# Patient Record
Sex: Male | Born: 1950 | Race: White | Hispanic: No | Marital: Married | Smoking: Former smoker
Health system: Southern US, Community
[De-identification: ages and names within clinical notes are randomized; demographics above are authoritative.]

## PROBLEM LIST (undated history)

## (undated) DIAGNOSIS — C801 Malignant (primary) neoplasm, unspecified: Secondary | ICD-10-CM

## (undated) DIAGNOSIS — Z789 Other specified health status: Secondary | ICD-10-CM

## (undated) DIAGNOSIS — M1990 Unspecified osteoarthritis, unspecified site: Secondary | ICD-10-CM

## (undated) DIAGNOSIS — K219 Gastro-esophageal reflux disease without esophagitis: Secondary | ICD-10-CM

## (undated) DIAGNOSIS — I10 Essential (primary) hypertension: Secondary | ICD-10-CM

## (undated) DIAGNOSIS — J189 Pneumonia, unspecified organism: Secondary | ICD-10-CM

## (undated) HISTORY — PX: OTHER SURGICAL HISTORY: SHX169

## (undated) HISTORY — DX: Gastro-esophageal reflux disease without esophagitis: K21.9

## (undated) HISTORY — PX: PROSTATE BIOPSY: SHX241

## (undated) HISTORY — PX: HAND SURGERY: SHX662

## (undated) HISTORY — DX: Other specified health status: Z78.9

---

## 1988-05-25 HISTORY — PX: EYE SURGERY: SHX253

## 2004-09-24 ENCOUNTER — Ambulatory Visit (HOSPITAL_BASED_OUTPATIENT_CLINIC_OR_DEPARTMENT_OTHER): Admission: RE | Admit: 2004-09-24 | Discharge: 2004-09-24 | Payer: Self-pay | Admitting: *Deleted

## 2019-02-23 DIAGNOSIS — R300 Dysuria: Secondary | ICD-10-CM | POA: Diagnosis not present

## 2019-02-23 DIAGNOSIS — Z6825 Body mass index (BMI) 25.0-25.9, adult: Secondary | ICD-10-CM | POA: Diagnosis not present

## 2019-03-16 DIAGNOSIS — N410 Acute prostatitis: Secondary | ICD-10-CM | POA: Diagnosis not present

## 2019-07-24 DIAGNOSIS — R69 Illness, unspecified: Secondary | ICD-10-CM | POA: Diagnosis not present

## 2019-07-31 DIAGNOSIS — R69 Illness, unspecified: Secondary | ICD-10-CM | POA: Diagnosis not present

## 2020-01-04 DIAGNOSIS — N410 Acute prostatitis: Secondary | ICD-10-CM | POA: Diagnosis not present

## 2020-01-04 DIAGNOSIS — Z6825 Body mass index (BMI) 25.0-25.9, adult: Secondary | ICD-10-CM | POA: Diagnosis not present

## 2020-05-06 DIAGNOSIS — H5200 Hypermetropia, unspecified eye: Secondary | ICD-10-CM | POA: Diagnosis not present

## 2020-05-06 DIAGNOSIS — H2513 Age-related nuclear cataract, bilateral: Secondary | ICD-10-CM | POA: Diagnosis not present

## 2020-05-06 DIAGNOSIS — Z0100 Encounter for examination of eyes and vision without abnormal findings: Secondary | ICD-10-CM | POA: Diagnosis not present

## 2020-10-17 DIAGNOSIS — Z20828 Contact with and (suspected) exposure to other viral communicable diseases: Secondary | ICD-10-CM | POA: Diagnosis not present

## 2021-07-08 DIAGNOSIS — K222 Esophageal obstruction: Secondary | ICD-10-CM | POA: Diagnosis not present

## 2021-07-08 DIAGNOSIS — Z6826 Body mass index (BMI) 26.0-26.9, adult: Secondary | ICD-10-CM | POA: Diagnosis not present

## 2021-07-08 DIAGNOSIS — R1030 Lower abdominal pain, unspecified: Secondary | ICD-10-CM | POA: Diagnosis not present

## 2021-07-08 DIAGNOSIS — R300 Dysuria: Secondary | ICD-10-CM | POA: Diagnosis not present

## 2021-07-08 DIAGNOSIS — R319 Hematuria, unspecified: Secondary | ICD-10-CM | POA: Diagnosis not present

## 2021-07-09 ENCOUNTER — Other Ambulatory Visit (HOSPITAL_COMMUNITY): Payer: Self-pay | Admitting: Family Medicine

## 2021-07-09 ENCOUNTER — Other Ambulatory Visit: Payer: Self-pay | Admitting: Family Medicine

## 2021-07-09 DIAGNOSIS — N200 Calculus of kidney: Secondary | ICD-10-CM

## 2021-07-09 DIAGNOSIS — R1030 Lower abdominal pain, unspecified: Secondary | ICD-10-CM

## 2021-07-09 DIAGNOSIS — R300 Dysuria: Secondary | ICD-10-CM

## 2021-07-10 ENCOUNTER — Ambulatory Visit: Payer: Medicare HMO | Admitting: Physician Assistant

## 2021-07-10 ENCOUNTER — Other Ambulatory Visit: Payer: Self-pay

## 2021-07-11 ENCOUNTER — Encounter: Payer: Self-pay | Admitting: Internal Medicine

## 2021-07-11 ENCOUNTER — Ambulatory Visit (HOSPITAL_COMMUNITY)
Admission: RE | Admit: 2021-07-11 | Discharge: 2021-07-11 | Disposition: A | Discharge status: Home / Self Care | Payer: Medicare HMO | Source: Ambulatory Visit | Attending: Family Medicine | Admitting: Family Medicine

## 2021-07-11 ENCOUNTER — Other Ambulatory Visit (HOSPITAL_COMMUNITY): Payer: Self-pay | Admitting: Family Medicine

## 2021-07-11 DIAGNOSIS — R1030 Lower abdominal pain, unspecified: Secondary | ICD-10-CM | POA: Insufficient documentation

## 2021-07-11 DIAGNOSIS — N200 Calculus of kidney: Secondary | ICD-10-CM

## 2021-07-11 DIAGNOSIS — R300 Dysuria: Secondary | ICD-10-CM | POA: Diagnosis not present

## 2021-07-11 DIAGNOSIS — N210 Calculus in bladder: Secondary | ICD-10-CM | POA: Diagnosis not present

## 2021-07-11 DIAGNOSIS — N3289 Other specified disorders of bladder: Secondary | ICD-10-CM | POA: Diagnosis not present

## 2021-07-16 ENCOUNTER — Encounter: Payer: Self-pay | Admitting: Urology

## 2021-07-16 ENCOUNTER — Ambulatory Visit: Payer: Medicare HMO | Admitting: Urology

## 2021-07-16 ENCOUNTER — Other Ambulatory Visit: Payer: Self-pay

## 2021-07-16 VITALS — BP 158/92 | HR 67 | Wt 164.0 lb

## 2021-07-16 DIAGNOSIS — R339 Retention of urine, unspecified: Secondary | ICD-10-CM

## 2021-07-16 DIAGNOSIS — N401 Enlarged prostate with lower urinary tract symptoms: Secondary | ICD-10-CM

## 2021-07-16 DIAGNOSIS — R300 Dysuria: Secondary | ICD-10-CM | POA: Diagnosis not present

## 2021-07-16 DIAGNOSIS — R8290 Unspecified abnormal findings in urine: Secondary | ICD-10-CM

## 2021-07-16 DIAGNOSIS — N200 Calculus of kidney: Secondary | ICD-10-CM

## 2021-07-16 DIAGNOSIS — N138 Other obstructive and reflux uropathy: Secondary | ICD-10-CM

## 2021-07-16 LAB — MICROSCOPIC EXAMINATION
Renal Epithel, UA: NONE SEEN /hpf
WBC, UA: >30 /hpf — AB (ref 0–5)

## 2021-07-16 LAB — URINALYSIS, ROUTINE W REFLEX MICROSCOPIC
Bilirubin, UA: NEGATIVE
Glucose, UA: NEGATIVE
Ketones, UA: NEGATIVE
Nitrite, UA: POSITIVE — AB
Specific Gravity, UA: 1.025 (ref 1.005–1.030)
Urobilinogen, Ur: 0.2 mg/dL (ref 0.2–1.0)
pH, UA: 6 (ref 5.0–7.5)

## 2021-07-16 LAB — BLADDER SCAN AMB NON-IMAGING

## 2021-07-16 MED ORDER — SULFAMETHOXAZOLE-TRIMETHOPRIM 800-160 MG PO TABS: 1.0000 | ORAL_TABLET | Freq: Two times a day (BID) | ORAL | 0 refills | Status: AC

## 2021-07-16 MED ORDER — ALFUZOSIN HCL ER 10 MG PO TB24: 10.0000 mg | ORAL_TABLET | Freq: Every day | ORAL | 11 refills | Status: DC

## 2021-07-16 NOTE — Progress Notes (Signed)
Assessment: 1. BPH with obstruction/lower urinary tract symptoms   2. Dysuria   3. Abnormal urine findings   4. Incomplete bladder emptying   5. Nephrolithiasis     Plan: I personally reviewed the CT study from 07/11/2021 showing a nonobstructing right renal stone and a small calculus in the bladder.  I discussed these findings with the patient.   Management options for BPH with obstruction discussed with the patient including observation, medical therapy with alpha blockers, combination therapy with 5 alpha reductase inhibitors, minimally invasive procedures, and surgery.  Given his incomplete bladder emptying and apparent UTI, I have recommended initiation of medical therapy. Urine culture sent today. Begin Bactrim DS twice daily x7 days.  Prescription sent. Begin alfuzosin 10 mg daily.  Use and side effects discussed. Return to office in 4 weeks with bladder scan.  Chief Complaint:  Chief Complaint  Patient presents with   Urinary Tract Infection    History of Present Illness:  Louis Campos is a 71 y.o. year old male who is seen in consultation from Curlene Labrum, MD for evaluation of lower urinary tract symptoms.  He has a history of UTIs approximately once a year for several years beginning in 2014.  These resolved spontaneously.  About 1 year ago he noted foul-smelling and cloudy urine.  He was tested for UTI and by report his culture was negative.  He has continued to have foul-smelling and cloudy urine.  He also reports urinary urgency at times, slight discomfort with voiding, weak stream, and nocturia 1-2 times. No gross hematuria or flank pain. IPSS = 13 today. CT renal stone study from 07/11/2021 showed a small nonobstructing right renal stone, small calculus in the bladder, no evidence of renal mass or obstruction.  Past Medical History:  Past Medical History:  Diagnosis Date   Acid reflux    No known health problems     Past Surgical History:  Past Surgical  History:  Procedure Laterality Date   None      Allergies:  No Known Allergies  Family History:  History reviewed. No pertinent family history.  Social History:  Social History   Tobacco Use   Smoking status: Every Day    Types: Cigarettes   Smokeless tobacco: Never  Vaping Use   Vaping Use: Never used  Substance Use Topics   Alcohol use: Yes   Drug use: Not Currently    Review of symptoms:  Constitutional:  Negative for unexplained weight loss, night sweats, fever, chills ENT:  Negative for nose bleeds, sinus pain, painful swallowing CV:  Negative for chest pain, shortness of breath, exercise intolerance, palpitations, loss of consciousness Resp:  Negative for cough, wheezing, shortness of breath GI:  Negative for nausea, vomiting, diarrhea, bloody stools GU:  Positives noted in HPI; otherwise negative for gross hematuria, urinary incontinence Neuro:  Negative for seizures, poor balance, limb weakness, slurred speech Psych:  Negative for lack of energy, depression, anxiety Endocrine:  Negative for polydipsia, polyuria, symptoms of hypoglycemia (dizziness, hunger, sweating) Hematologic:  Negative for anemia, purpura, petechia, prolonged or excessive bleeding, use of anticoagulants  Allergic:  Negative for difficulty breathing or choking as a result of exposure to anything; no shellfish allergy; no allergic response (rash/itch) to materials, foods  Physical exam: BP (!) 158/92    Pulse 67    Wt 164 lb (74.4 kg)  GENERAL APPEARANCE:  Well appearing, well developed, well nourished, NAD HEENT: Atraumatic, Normocephalic, oropharynx clear. NECK: Supple without lymphadenopathy or thyromegaly. LUNGS:  Clear to auscultation bilaterally. HEART: Regular Rate and Rhythm without murmurs, gallops, or rubs. ABDOMEN: Soft, non-tender, No Masses. EXTREMITIES: Moves all extremities well.  Without clubbing, cyanosis, or edema. NEUROLOGIC:  Alert and oriented x 3, normal gait, CN II-XII  grossly intact.  MENTAL STATUS:  Appropriate. BACK:  Non-tender to palpation.  No CVAT SKIN:  Warm, dry and intact.   GU: Penis:  uncircumcised Meatus: Normal Scrotum: normal, no masses Testis: normal without masses bilateral Epididymis: normal Prostate: 40 g, NT, no nodules Rectum: Normal tone,  no masses or tenderness   Results: U/A:  >30 WBC, 11-30 RBC, many bacteria, nitrite +  Results for orders placed or performed in visit on 07/16/21 (from the past 24 hour(s))  BLADDER SCAN AMB NON-IMAGING   Collection Time: 07/16/21  3:09 PM  Result Value Ref Range   Scan Result 323ml

## 2021-07-16 NOTE — Progress Notes (Signed)
post void residual=337ml

## 2021-07-24 ENCOUNTER — Telehealth: Payer: Self-pay

## 2021-07-24 LAB — URINE CULTURE

## 2021-07-24 MED ORDER — NITROFURANTOIN MONOHYD MACRO 100 MG PO CAPS: 100.0000 mg | ORAL_CAPSULE | Freq: Two times a day (BID) | ORAL | 0 refills | Status: AC

## 2021-07-24 NOTE — Telephone Encounter (Signed)
Patient called and made aware.

## 2021-07-24 NOTE — Addendum Note (Signed)
Addended by: Primus Bravo on: 07/24/2021 01:21 PM   Modules accepted: Orders

## 2021-07-24 NOTE — Telephone Encounter (Signed)
-----   Message from Milderd Meager, MD sent at 07/24/2021  1:20 PM EST ----- ?Please notify the patient that his urine culture showed evidence of UTI that is resistant to Bactrim.  I have sent in a prescription for Macrobid for him to take for treatment of the UTI.  Keep follow-up as scheduled. ?

## 2021-08-12 NOTE — H&P (View-Only) (Signed)
? ?Referring Provider: Burdine, Steven E, MD ?Primary Care Physician:  Burdine, Steven E, MD ?Primary Gastroenterologist:  Dr. Carver ? ?Chief Complaint  ?Patient presents with  ? Dysphagia  ?  Trouble swallowing  ? ? ?HPI:   ?Louis Campos is a 71 y.o. male presenting today at the request of Burdine, Steven E, MD for esophageal stricture, difficulty swallowing food and pills.  ? ?Reviewed office visit note with PCP dated 07/08/2021.  Patient reported 6-month history of intermittent dysphagia.  Reported trouble with pills and had had 2 coughs up food and reswallow it in the past.  He was also having some trouble with lower abdominal discomfort with urination and when Was on his lap.  Also noted particulate matter floating in the urine as well as nocturia.  Labs including c-Met, CBC, TSH, H. pylori IgG, urine culture were ordered, but no results included in referral information.  CT A/P was also ordered, but no results provided.  He was referred to GI and urology. ? ?Reviewed CT renal study completed 2/17 revealing tiny obstructing right renal stone, small calculus in the bladder likely related to previously passed stone.  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 ? ?He did establish with urology and was found to have a UTI.  He was prescribed Macrobid. ? ?Today: ?Dysphagia intermittently for years. Took a vitamin about 1 month ago and he couldn't get it up. States he "almost choaked to death". Trouble with foods and pills. No trouble with liquids.  Items are getting hung in the upper esophagus/throat area.  No prior EGD.  No family history of esophageal cancer.  No unintentional weight loss. ? ?Denies history of heartburn. PCP started Protonix in February when he reported trouble with dysphagia.  He has not noticed any change in his symptoms. ? ?No prior colonoscopy. Not interested in a colonoscopy or Cologuard.  Denies constipation, diarrhea, BRBPR, melena.  Denies abdominal pain. ? ?Aleve 2 a day for arthritis pain.   ? ?Past Medical History:  ?Diagnosis Date  ? Acid reflux   ? patient denies  ? ? ?Past Surgical History:  ?Procedure Laterality Date  ? HAND SURGERY    ? right  ? ? ?Current Outpatient Medications  ?Medication Sig Dispense Refill  ? alfuzosin (UROXATRAL) 10 MG 24 hr tablet Take 1 tablet (10 mg total) by mouth daily with breakfast. 30 tablet 11  ? pantoprazole (PROTONIX) 40 MG tablet Take 40 mg by mouth daily.    ? ?No current facility-administered medications for this visit.  ? ? ?Allergies as of 08/14/2021  ? (No Known Allergies)  ? ? ?Family History  ?Problem Relation Age of Onset  ? Colon cancer Neg Hx   ? Esophageal cancer Neg Hx   ? ? ?Social History  ? ?Socioeconomic History  ? Marital status: Single  ?  Spouse name: Not on file  ? Number of children: Not on file  ? Years of education: Not on file  ? Highest education level: Not on file  ?Occupational History  ? Not on file  ?Tobacco Use  ? Smoking status: Former  ?  Types: Cigarettes  ?  Quit date: 2000  ?  Years since quitting: 23.2  ? Smokeless tobacco: Never  ?Vaping Use  ? Vaping Use: Never used  ?Substance and Sexual Activity  ? Alcohol use: Yes  ?  Comment: couple of shots every week.  ? Drug use: Not Currently  ? Sexual activity: Not on file  ?Other Topics Concern  ? Not on file  ?  Social History Narrative  ? Not on file  ? ?Social Determinants of Health  ? ?Financial Resource Strain: Not on file  ?Food Insecurity: Not on file  ?Transportation Needs: Not on file  ?Physical Activity: Not on file  ?Stress: Not on file  ?Social Connections: Not on file  ?Intimate Partner Violence: Not on file  ? ? ?Review of Systems: ?Gen: Denies any fever, chills, cold or flulike symptoms, presyncope, syncope. ?CV: Denies chest pain, heart palpitations. ?Resp: Denies shortness of breath or cough. ?GI: See HPI ?GU : Denies urinary burning, urinary frequency, urinary hesitancy ?MS: Admits to joint pain. ?Derm: Denies rash ?Psych: Denies depression, anxiety ?Heme: See  HPI ? ?Physical Exam: ?BP 128/72   Pulse 74   Temp (!) 97.5 ?F (36.4 ?C) (Temporal)   Ht 5' 8" (1.727 m)   Wt 165 lb 9.6 oz (75.1 kg)   BMI 25.18 kg/m?  ?General:   Alert and oriented. Pleasant and cooperative. Well-nourished and well-developed.  ?Head:  Normocephalic and atraumatic. ?Eyes:  Without icterus, sclera clear and conjunctiva pink.  ?Ears:  Normal auditory acuity. ?Heart:  S1, S2 present without murmurs appreciated.  ?Abdomen:  +BS, soft, non-tender and non-distended. No HSM noted. No guarding or rebound. No masses appreciated.  ?Rectal:  Deferred  ?Msk:  Symmetrical without gross deformities. Normal posture. ?Extremities:  Without edema. ?Neurologic:  Alert and  oriented x4;  grossly normal neurologically. ?Skin:  Intact without significant lesions or rashes. ?Psych:  Normal mood and affect. ? ? ? ?Assessment:  ?71-year-old male presenting today for further evaluation of dysphagia which he reports has been going on intermittently for years, but worsening more recently.  He has trouble with foods and pills, but no trouble with liquids.  He has had to regurgitate foods and pills in the past.  Denies any history of heartburn.  PCP empirically started him on Protonix in February without change in symptoms.  Denies unintentional weight loss or any other significant GI symptoms.  No family history of esophageal cancer.  Differentials include esophageal web, ring, stricture, and malignancy.  He needs EGD with possible dilation for further evaluation and therapeutic intervention. ? ?Colon cancer screening: ?Patient has never had a colonoscopy.  He declined colonoscopy and Cologuard today. No concerning lower GI symptoms.  No family history of colon cancer. ? ? ?Plan:  ?Proceed with EGD with propofol with Dr. Carver in the near future. The risks, benefits, and alternatives have been discussed with the patient in detail. The patient states understanding and desires to proceed. ?ASA 2 ?Avoid tough textures,  chopped meats finely, eat slowly, take small bites, chew thoroughly, drink plenty of liquids throughout meals. ?Patient is to proceed to the emergency room if something gets stuck in his esophagus and will not come up or go down. ?Follow-up after EGD. ? ? ?Blanca Thornton, PA-C ?[facility] ?08/14/2021 ?  ?

## 2021-08-12 NOTE — Progress Notes (Signed)
? ?Referring Provider: Burdine, Steven E, MD ?Primary Care Physician:  Burdine, Steven E, MD ?Primary Gastroenterologist:  Dr. Carver ? ?Chief Complaint  ?Patient presents with  ? Dysphagia  ?  Trouble swallowing  ? ? ?HPI:   ?Louis Campos is a 71 y.o. male presenting today at the request of Burdine, Steven E, MD for esophageal stricture, difficulty swallowing food and pills.  ? ?Reviewed office visit note with PCP dated 07/08/2021.  Patient reported 6-month history of intermittent dysphagia.  Reported trouble with pills and had had 2 coughs up food and reswallow it in the past.  He was also having some trouble with lower abdominal discomfort with urination and when Was on his lap.  Also noted particulate matter floating in the urine as well as nocturia.  Labs including c-Met, CBC, TSH, H. pylori IgG, urine culture were ordered, but no results included in referral information.  CT A/P was also ordered, but no results provided.  He was referred to GI and urology. ? ?Reviewed CT renal study completed 2/17 revealing tiny obstructing right renal stone, small calculus in the bladder likely related to previously passed stone.  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 ? ?He did establish with urology and was found to have a UTI.  He was prescribed Macrobid. ? ?Today: ?Dysphagia intermittently for years. Took a vitamin about 1 month ago and he couldn't get it up. States he "almost choaked to death". Trouble with foods and pills. No trouble with liquids.  Items are getting hung in the upper esophagus/throat area.  No prior EGD.  No family history of esophageal cancer.  No unintentional weight loss. ? ?Denies history of heartburn. PCP started Protonix in February when he reported trouble with dysphagia.  He has not noticed any change in his symptoms. ? ?No prior colonoscopy. Not interested in a colonoscopy or Cologuard.  Denies constipation, diarrhea, BRBPR, melena.  Denies abdominal pain. ? ?Aleve 2 a day for arthritis pain.   ? ?Past Medical History:  ?Diagnosis Date  ? Acid reflux   ? patient denies  ? ? ?Past Surgical History:  ?Procedure Laterality Date  ? HAND SURGERY    ? right  ? ? ?Current Outpatient Medications  ?Medication Sig Dispense Refill  ? alfuzosin (UROXATRAL) 10 MG 24 hr tablet Take 1 tablet (10 mg total) by mouth daily with breakfast. 30 tablet 11  ? pantoprazole (PROTONIX) 40 MG tablet Take 40 mg by mouth daily.    ? ?No current facility-administered medications for this visit.  ? ? ?Allergies as of 08/14/2021  ? (No Known Allergies)  ? ? ?Family History  ?Problem Relation Age of Onset  ? Colon cancer Neg Hx   ? Esophageal cancer Neg Hx   ? ? ?Social History  ? ?Socioeconomic History  ? Marital status: Single  ?  Spouse name: Not on file  ? Number of children: Not on file  ? Years of education: Not on file  ? Highest education level: Not on file  ?Occupational History  ? Not on file  ?Tobacco Use  ? Smoking status: Former  ?  Types: Cigarettes  ?  Quit date: 2000  ?  Years since quitting: 23.2  ? Smokeless tobacco: Never  ?Vaping Use  ? Vaping Use: Never used  ?Substance and Sexual Activity  ? Alcohol use: Yes  ?  Comment: couple of shots every week.  ? Drug use: Not Currently  ? Sexual activity: Not on file  ?Other Topics Concern  ? Not on file  ?  Social History Narrative  ? Not on file  ? ?Social Determinants of Health  ? ?Financial Resource Strain: Not on file  ?Food Insecurity: Not on file  ?Transportation Needs: Not on file  ?Physical Activity: Not on file  ?Stress: Not on file  ?Social Connections: Not on file  ?Intimate Partner Violence: Not on file  ? ? ?Review of Systems: ?Gen: Denies any fever, chills, cold or flulike symptoms, presyncope, syncope. ?CV: Denies chest pain, heart palpitations. ?Resp: Denies shortness of breath or cough. ?GI: See HPI ?GU : Denies urinary burning, urinary frequency, urinary hesitancy ?MS: Admits to joint pain. ?Derm: Denies rash ?Psych: Denies depression, anxiety ?Heme: See  HPI ? ?Physical Exam: ?BP 128/72   Pulse 74   Temp (!) 97.5 ?F (36.4 ?C) (Temporal)   Ht 5' 8" (1.727 m)   Wt 165 lb 9.6 oz (75.1 kg)   BMI 25.18 kg/m?  ?General:   Alert and oriented. Pleasant and cooperative. Well-nourished and well-developed.  ?Head:  Normocephalic and atraumatic. ?Eyes:  Without icterus, sclera clear and conjunctiva pink.  ?Ears:  Normal auditory acuity. ?Heart:  S1, S2 present without murmurs appreciated.  ?Abdomen:  +BS, soft, non-tender and non-distended. No HSM noted. No guarding or rebound. No masses appreciated.  ?Rectal:  Deferred  ?Msk:  Symmetrical without gross deformities. Normal posture. ?Extremities:  Without edema. ?Neurologic:  Alert and  oriented x4;  grossly normal neurologically. ?Skin:  Intact without significant lesions or rashes. ?Psych:  Normal mood and affect. ? ? ? ?Assessment:  ?71 year old male presenting today for further evaluation of dysphagia which he reports has been going on intermittently for years, but worsening more recently.  He has trouble with foods and pills, but no trouble with liquids.  He has had to regurgitate foods and pills in the past.  Denies any history of heartburn.  PCP empirically started him on Protonix in February without change in symptoms.  Denies unintentional weight loss or any other significant GI symptoms.  No family history of esophageal cancer.  Differentials include esophageal web, ring, stricture, and malignancy.  He needs EGD with possible dilation for further evaluation and therapeutic intervention. ? ?Colon cancer screening: ?Patient has never had a colonoscopy.  He declined colonoscopy and Cologuard today. No concerning lower GI symptoms.  No family history of colon cancer. ? ? ?Plan:  ?Proceed with EGD with propofol with Dr. Abbey Chatters in the near future. The risks, benefits, and alternatives have been discussed with the patient in detail. The patient states understanding and desires to proceed. ?ASA 2 ?Avoid tough textures,  chopped meats finely, eat slowly, take small bites, chew thoroughly, drink plenty of liquids throughout meals. ?Patient is to proceed to the emergency room if something gets stuck in his esophagus and will not come up or go down. ?Follow-up after EGD. ? ? ?Aliene Altes, PA-C ?[facility] ?08/14/2021 ?  ?

## 2021-08-14 ENCOUNTER — Encounter: Payer: Self-pay | Admitting: Gastroenterology

## 2021-08-14 ENCOUNTER — Ambulatory Visit: Payer: Medicare HMO | Admitting: Gastroenterology

## 2021-08-14 ENCOUNTER — Other Ambulatory Visit: Payer: Self-pay

## 2021-08-14 VITALS — BP 128/72 | HR 74 | Temp 97.5°F | Ht 68.0 in | Wt 165.6 lb

## 2021-08-14 DIAGNOSIS — Z1211 Encounter for screening for malignant neoplasm of colon: Secondary | ICD-10-CM | POA: Diagnosis not present

## 2021-08-14 DIAGNOSIS — R1310 Dysphagia, unspecified: Secondary | ICD-10-CM | POA: Insufficient documentation

## 2021-08-14 NOTE — Patient Instructions (Signed)
We will arrange for you to have an upper endoscopy with possible dilation of your esophagus in the near future with Dr. Marletta Lor. ? ?For now I recommend: ?Avoiding tough textures. ?Meats should be chopped finely. ?Eat slowly, take small bites, chew thoroughly, drink plenty of liquids throughout your meals. ? ?If something gets stuck in your esophagus and will not come up or go down after a couple of hours, proceed to the emergency room. ? ?We will follow-up with you in the office after your upper endoscopy.  Do not hesitate to call if you have any questions or concerns prior to your next visit. ? ?It was very nice meeting you today! ? ?Ermalinda Memos, PA-C ?[facility] ? ?

## 2021-08-19 ENCOUNTER — Other Ambulatory Visit: Payer: Self-pay

## 2021-08-19 ENCOUNTER — Encounter: Payer: Self-pay | Admitting: Urology

## 2021-08-19 ENCOUNTER — Ambulatory Visit: Payer: Medicare HMO | Admitting: Urology

## 2021-08-19 VITALS — BP 123/75 | HR 70

## 2021-08-19 DIAGNOSIS — N138 Other obstructive and reflux uropathy: Secondary | ICD-10-CM

## 2021-08-19 DIAGNOSIS — N401 Enlarged prostate with lower urinary tract symptoms: Secondary | ICD-10-CM

## 2021-08-19 DIAGNOSIS — R339 Retention of urine, unspecified: Secondary | ICD-10-CM | POA: Diagnosis not present

## 2021-08-19 DIAGNOSIS — Z87440 Personal history of urinary (tract) infections: Secondary | ICD-10-CM

## 2021-08-19 DIAGNOSIS — N200 Calculus of kidney: Secondary | ICD-10-CM | POA: Diagnosis not present

## 2021-08-19 MED ORDER — SILODOSIN 8 MG PO CAPS: 8.0000 mg | ORAL_CAPSULE | Freq: Every day | ORAL | 11 refills | Status: DC

## 2021-08-19 NOTE — Progress Notes (Signed)
post void residual=330 ?

## 2021-08-19 NOTE — Progress Notes (Signed)
? ?Assessment: ?1. BPH with obstruction/lower urinary tract symptoms   ?2. Nephrolithiasis   ?3. Incomplete bladder emptying   ?4. History of UTI   ? ? ? ?Plan: ?Management options for BPH with obstruction discussed with the patient including observation, medical therapy with alpha blockers, combination therapy with 5 alpha reductase inhibitors, minimally invasive procedures, and surgery.   ?D/C alfuzosin ?Trial of silodosin 8 mg daily.  Rx sent. ?Return to office in 3-4 weeks with bladder scan, possible cystoscopy ? ?Chief Complaint:  ?Chief Complaint  ?Patient presents with  ? Benign Prostatic Hypertrophy  ? ? ?History of Present Illness: ? ?Louis Campos is a 71 y.o. year old male who is seen for further evaluation of lower urinary tract symptoms.  He has a history of UTIs approximately once a year for several years beginning in 2014.  These resolved spontaneously.  About 1 year ago he noted foul-smelling and cloudy urine.  He was tested for UTI and by report his culture was negative.  He has continued to have foul-smelling and cloudy urine.  He also reports urinary urgency at times, slight discomfort with voiding, weak stream, and nocturia 1-2 times. ?No gross hematuria or flank pain. ?IPSS = 13.  PVR = 350 mL. ?CT renal stone study from 07/11/2021 showed a small nonobstructing right renal stone, small calculus in the bladder, no evidence of renal mass or obstruction. ?Urine culture from 2/23 grew 25-50K staph haemolyticus. Treated with macrobid. ? ?He returns today for follow-up.  He continues on alfuzosin.  He has noted an improvement in the color and odor of his urine.  He continues to have a decreased stream, frequency, and sensation of incomplete emptying.  No dysuria or gross hematuria. ?IPSS = 15 today. ? ?Portions of the above documentation were copied from a prior visit for review purposes only. ? ?Past Medical History:  ?Past Medical History:  ?Diagnosis Date  ? Acid reflux   ? patient denies   ? ? ?Past Surgical History:  ?Past Surgical History:  ?Procedure Laterality Date  ? HAND SURGERY    ? right  ? ? ?Allergies:  ?No Known Allergies ? ?Family History:  ?Family History  ?Problem Relation Age of Onset  ? Colon cancer Neg Hx   ? Esophageal cancer Neg Hx   ? ? ?Social History:  ?Social History  ? ?Tobacco Use  ? Smoking status: Former  ?  Types: Cigarettes  ?  Quit date: 2000  ?  Years since quitting: 23.2  ? Smokeless tobacco: Never  ?Vaping Use  ? Vaping Use: Never used  ?Substance Use Topics  ? Alcohol use: Yes  ?  Comment: couple of shots every week.  ? Drug use: Not Currently  ? ? ?ROS: ?Constitutional:  Negative for fever, chills, weight loss ?CV: Negative for chest pain, previous MI, hypertension ?Respiratory:  Negative for shortness of breath, wheezing, sleep apnea, frequent cough ?GI:  Negative for nausea, vomiting, bloody stool, GERD ? ?Physical exam: ?BP 123/75   Pulse 70  ?GENERAL APPEARANCE:  Well appearing, well developed, well nourished, NAD ?HEENT:  Atraumatic, normocephalic, oropharynx clear ?NECK:  Supple without lymphadenopathy or thyromegaly ?ABDOMEN:  Soft, non-tender, no masses ?EXTREMITIES:  Moves all extremities well, without clubbing, cyanosis, or edema ?NEUROLOGIC:  Alert and oriented x 3, normal gait, CN II-XII grossly intact ?MENTAL STATUS:  appropriate ?BACK:  Non-tender to palpation, No CVAT ?SKIN:  Warm, dry, and intact ? ? ?Results: ?U/A dipstick negative ? ?PVR = 330 mL ? ? ?

## 2021-08-20 LAB — URINALYSIS, ROUTINE W REFLEX MICROSCOPIC
Bilirubin, UA: NEGATIVE
Glucose, UA: NEGATIVE
Ketones, UA: NEGATIVE
Leukocytes,UA: NEGATIVE
Nitrite, UA: NEGATIVE
Protein,UA: NEGATIVE
RBC, UA: NEGATIVE
Specific Gravity, UA: 1.025 (ref 1.005–1.030)
Urobilinogen, Ur: 0.2 mg/dL (ref 0.2–1.0)
pH, UA: 5.5 (ref 5.0–7.5)

## 2021-09-09 ENCOUNTER — Ambulatory Visit (HOSPITAL_COMMUNITY)
Admission: RE | Admit: 2021-09-09 | Discharge: 2021-09-09 | Disposition: A | Discharge status: Home / Self Care | Payer: Medicare HMO | Attending: Internal Medicine | Admitting: Internal Medicine

## 2021-09-09 ENCOUNTER — Encounter (HOSPITAL_COMMUNITY)
Admission: RE | Disposition: A | Discharge status: Home / Self Care | Payer: Self-pay | Source: Home / Self Care | Attending: Internal Medicine

## 2021-09-09 ENCOUNTER — Ambulatory Visit (HOSPITAL_BASED_OUTPATIENT_CLINIC_OR_DEPARTMENT_OTHER): Payer: Medicare HMO | Admitting: Certified Registered Nurse Anesthetist

## 2021-09-09 ENCOUNTER — Ambulatory Visit (HOSPITAL_COMMUNITY): Payer: Medicare HMO | Admitting: Certified Registered Nurse Anesthetist

## 2021-09-09 ENCOUNTER — Encounter (HOSPITAL_COMMUNITY): Payer: Self-pay

## 2021-09-09 ENCOUNTER — Other Ambulatory Visit: Payer: Self-pay

## 2021-09-09 DIAGNOSIS — R1310 Dysphagia, unspecified: Secondary | ICD-10-CM | POA: Diagnosis not present

## 2021-09-09 DIAGNOSIS — K219 Gastro-esophageal reflux disease without esophagitis: Secondary | ICD-10-CM | POA: Insufficient documentation

## 2021-09-09 DIAGNOSIS — K319 Disease of stomach and duodenum, unspecified: Secondary | ICD-10-CM | POA: Insufficient documentation

## 2021-09-09 DIAGNOSIS — K449 Diaphragmatic hernia without obstruction or gangrene: Secondary | ICD-10-CM | POA: Insufficient documentation

## 2021-09-09 DIAGNOSIS — K259 Gastric ulcer, unspecified as acute or chronic, without hemorrhage or perforation: Secondary | ICD-10-CM | POA: Insufficient documentation

## 2021-09-09 DIAGNOSIS — K2950 Unspecified chronic gastritis without bleeding: Secondary | ICD-10-CM | POA: Insufficient documentation

## 2021-09-09 DIAGNOSIS — K2270 Barrett's esophagus without dysplasia: Secondary | ICD-10-CM | POA: Diagnosis not present

## 2021-09-09 DIAGNOSIS — Z79899 Other long term (current) drug therapy: Secondary | ICD-10-CM | POA: Insufficient documentation

## 2021-09-09 DIAGNOSIS — K2289 Other specified disease of esophagus: Secondary | ICD-10-CM

## 2021-09-09 DIAGNOSIS — Z87891 Personal history of nicotine dependence: Secondary | ICD-10-CM | POA: Diagnosis not present

## 2021-09-09 HISTORY — PX: BIOPSY: SHX5522

## 2021-09-09 HISTORY — PX: ESOPHAGOGASTRODUODENOSCOPY (EGD) WITH PROPOFOL: SHX5813

## 2021-09-09 HISTORY — PX: ESOPHAGEAL BRUSHING: SHX6842

## 2021-09-09 LAB — KOH PREP

## 2021-09-09 SURGERY — ESOPHAGOGASTRODUODENOSCOPY (EGD) WITH PROPOFOL
Anesthesia: General

## 2021-09-09 MED ORDER — LIDOCAINE HCL (CARDIAC) PF 100 MG/5ML IV SOSY
PREFILLED_SYRINGE | INTRAVENOUS | Status: DC | PRN
  Administered 2021-09-09: 50 mg via INTRAVENOUS

## 2021-09-09 MED ORDER — LACTATED RINGERS IV SOLN: INTRAVENOUS | Status: DC

## 2021-09-09 MED ORDER — PROPOFOL 10 MG/ML IV BOLUS
INTRAVENOUS | Status: DC | PRN
  Administered 2021-09-09: 100 mg via INTRAVENOUS
  Administered 2021-09-09: 50 mg via INTRAVENOUS
  Administered 2021-09-09: 30 mg via INTRAVENOUS

## 2021-09-09 MED ORDER — PANTOPRAZOLE SODIUM 40 MG PO TBEC: 40.0000 mg | DELAYED_RELEASE_TABLET | Freq: Two times a day (BID) | ORAL | 11 refills | Status: AC

## 2021-09-09 NOTE — Discharge Instructions (Addendum)
EGD ?Discharge instructions ?Please read the instructions outlined below and refer to this sheet in the next few weeks. These discharge instructions provide you with general information on caring for yourself after you leave the hospital. Your doctor may also give you specific instructions. While your treatment has been planned according to the most current medical practices available, unavoidable complications occasionally occur. If you have any problems or questions after discharge, please call your doctor. ?ACTIVITY ?You may resume your regular activity but move at a slower pace for the next 24 hours.  ?Take frequent rest periods for the next 24 hours.  ?Walking will help expel (get rid of) the air and reduce the bloated feeling in your abdomen.  ?No driving for 24 hours (because of the anesthesia (medicine) used during the test).  ?You may shower.  ?Do not sign any important legal documents or operate any machinery for 24 hours (because of the anesthesia used during the test).  ?NUTRITION ?Drink plenty of fluids.  ?You may resume your normal diet.  ?Begin with a light meal and progress to your normal diet.  ?Avoid alcoholic beverages for 24 hours or as instructed by your caregiver.  ?MEDICATIONS ?You may resume your normal medications unless your caregiver tells you otherwise.  ?WHAT YOU CAN EXPECT TODAY ?You may experience abdominal discomfort such as a feeling of fullness or ?gas? pains.  ?FOLLOW-UP ?Your doctor will discuss the results of your test with you.  ?SEEK IMMEDIATE MEDICAL ATTENTION IF ANY OF THE FOLLOWING OCCUR: ?Excessive nausea (feeling sick to your stomach) and/or vomiting.  ?Severe abdominal pain and distention (swelling).  ?Trouble swallowing.  ?Temperature over 101? F (37.8? C).  ?Rectal bleeding or vomiting of blood.  ? ? ?Your EGD revealed mild amount inflammation in your stomach as well as a small ulcer.  I took biopsies of this to rule out infection with a bacteria called H. pylori.  You  also have what appears to be Barrett's esophagus which is a precancerous condition of the esophagus.  I took numerous biopsies of this as well.  I also sampled your esophagus to rule out yeast infection.  No obvious stricture requiring dilation today.   ? ?Await pathology results, my office will contact you.  I am going to start you on a new medication called pantoprazole.  I want you to take 40 mg twice daily for 12 weeks at which point you can decrease down to once daily.  Avoid NSAIDs as best as you can.  Follow-up with GI in 3 to 4 months. ? ? ?I hope you have a great rest of your week! ? ?Hennie Duos. Marletta Lor, D.O. ?Gastroenterology and Hepatology ?[facility] ? ?

## 2021-09-09 NOTE — Op Note (Signed)
[facility] ?Patient Name: Louis Campos ?Procedure Date: 09/09/2021 11:20 AM ?MRN: [id number] ?Date of Birth: 1950/12/30 ?Attending MD: Elon Alas. Abbey Chatters , DO ?CSN: [id number] ?Age: 71 ?Admit Type: Outpatient ?Procedure:                Upper GI endoscopy ?Indications:              Dysphagia ?Providers:                Elon Alas. Abbey Chatters, DO, Charlsie Quest. Theda Sers RN, Therapist, sports,  ?                          Aram Candela ?Referring MD:              ?Medicines:                See the Anesthesia note for documentation of the  ?                          administered medications ?Complications:            No immediate complications. ?Estimated Blood Loss:     Estimated blood loss was minimal. ?Procedure:                Pre-Anesthesia Assessment: ?                          - The anesthesia plan was to use monitored  ?                          anesthesia care (MAC). ?                          After obtaining informed consent, the endoscope was  ?                          passed under direct vision. Throughout the  ?                          procedure, the patient's blood pressure, pulse, and  ?                          oxygen saturations were monitored continuously. The  ?                          GIF-H190 ([id number]) scope was introduced through the  ?                          mouth, and advanced to the second part of duodenum.  ?                          The upper GI endoscopy was accomplished without  ?                          difficulty. The patient tolerated the procedure  ?                          well. ?Scope In: 11:28:54 AM ?Scope  Out: 11:34:45 AM ?Total Procedure Duration: 0 hours 5 minutes 51 seconds  ?Findings: ?     Localized mucosal changes characterized by white specks were found in  ?     the middle third of the esophagus. Cells for cytology were obtained by  ?     brushing. ?     A 2 cm hiatal hernia was present. ?     The esophagus and gastroesophageal junction were examined with white  ?     light and narrow band  imaging (NBI) from a forward view and retroflexed  ?     position. There were esophageal mucosal changes consistent with  ?     short-segment Barrett's esophagus. These changes involved the mucosa at  ?     the upper extent of the gastric folds (36 cm from the incisors)  ?     extending to the Z-line (34 cm from the incisors). Three tongues of  ?     salmon-colored mucosa were present. The maximum longitudinal extent of  ?     these esophageal mucosal changes was 2 cm in length. Biopsies were taken  ?     with a cold forceps for histology. ?     One non-bleeding superficial gastric ulcer with no stigmata of bleeding  ?     was found at the pylorus. The lesion was 4 mm in largest dimension.  ?     Biopsies were taken with a cold forceps for Helicobacter pylori testing. ?     The duodenal bulb, first portion of the duodenum and second portion of  ?     the duodenum were normal. ?Impression:               - White specked mucosa in the esophagus. Cells for  ?                          cytology obtained. ?                          - 2 cm hiatal hernia. ?                          - Esophageal mucosal changes consistent with  ?                          short-segment Barrett's esophagus. Biopsied. ?                          - Non-bleeding gastric ulcer with no stigmata of  ?                          bleeding. Biopsied. ?                          - Normal duodenal bulb, first portion of the  ?                          duodenum and second portion of the duodenum. ?Moderate Sedation: ?     Per Anesthesia Care ?Recommendation:           - Patient has a contact number available for  ?  emergencies. The signs and symptoms of potential  ?                          delayed complications were discussed with the  ?                          patient. Return to normal activities tomorrow.  ?                          Written discharge instructions were provided to the  ?                          patient. ?                           - Resume previous diet. ?                          - Continue present medications. ?                          - Await pathology results. ?                          - Use Protonix (pantoprazole) 40 mg PO BID for 12  ?                          weeks then decrease down to once daily ?                          - Return to GI clinic in 4 months. ?Procedure Code(s):        --- Professional --- ?                          [phone], Esophagogastroduodenoscopy, flexible,  ?                          transoral; with biopsy, single or multiple ?Diagnosis Code(s):        --- Professional --- ?                          K22.8, Other specified diseases of esophagus ?                          K44.9, Diaphragmatic hernia without obstruction or  ?                          gangrene ?                          K25.9, Gastric ulcer, unspecified as acute or  ?                          chronic, without hemorrhage or perforation ?                          R13.10,  Dysphagia, unspecified ?CPT copyright 2019 American Medical Association. All rights reserved. ?The codes documented in this report are preliminary and upon coder review may  ?be revised to meet current compliance requirements. ?Elon Alas. Abbey Chatters, DO ?Elon Alas. [place], DO ?09/09/2021 11:39:42 AM ?This report has been signed electronically. ?Number of Addenda: 0 ?

## 2021-09-09 NOTE — Interval H&P Note (Signed)
History and Physical Interval Note: ? ?09/09/2021 ?11:20 AM ? ?Louis Campos  has presented today for surgery, with the diagnosis of dysphagia.  The various methods of treatment have been discussed with the patient and family. After consideration of risks, benefits and other options for treatment, the patient has consented to  Procedure(s) with comments: ?ESOPHAGOGASTRODUODENOSCOPY (EGD) WITH PROPOFOL (N/A) - 2:15pm ?BALLOON DILATION (N/A) as a surgical intervention.  The patient's history has been reviewed, patient examined, no change in status, stable for surgery.  I have reviewed the patient's chart and labs.  Questions were answered to the patient's satisfaction.   ? ? ?Eloise Harman ? ? ?

## 2021-09-09 NOTE — Anesthesia Preprocedure Evaluation (Signed)
Anesthesia Evaluation  Patient identified by MRN, date of birth, ID band Patient awake    Reviewed: Allergy & Precautions, H&P , NPO status , Patient's Chart, lab work & pertinent test results, reviewed documented beta blocker date and time   Airway Mallampati: II  TM Distance: >3 FB Neck ROM: full    Dental no notable dental hx.    Pulmonary neg pulmonary ROS, former smoker,    Pulmonary exam normal breath sounds clear to auscultation       Cardiovascular Exercise Tolerance: Good negative cardio ROS   Rhythm:regular Rate:Normal     Neuro/Psych negative neurological ROS  negative psych ROS   GI/Hepatic Neg liver ROS, GERD  Medicated,  Endo/Other  negative endocrine ROS  Renal/GU negative Renal ROS  negative genitourinary   Musculoskeletal   Abdominal   Peds  Hematology negative hematology ROS (+)   Anesthesia Other Findings   Reproductive/Obstetrics negative OB ROS                             Anesthesia Physical Anesthesia Plan  ASA: 2  Anesthesia Plan: General   Post-op Pain Management:    Induction:   PONV Risk Score and Plan: Propofol infusion  Airway Management Planned:   Additional Equipment:   Intra-op Plan:   Post-operative Plan:   Informed Consent: I have reviewed the patients History and Physical, chart, labs and discussed the procedure including the risks, benefits and alternatives for the proposed anesthesia with the patient or authorized representative who has indicated his/her understanding and acceptance.     Dental Advisory Given  Plan Discussed with: CRNA  Anesthesia Plan Comments:         Anesthesia Quick Evaluation  

## 2021-09-09 NOTE — Transfer of Care (Signed)
Immediate Anesthesia Transfer of Care Note ? ?Patient: Louis Campos ? ?Procedure(s) Performed: ESOPHAGOGASTRODUODENOSCOPY (EGD) WITH PROPOFOL ?BIOPSY ?ESOPHAGEAL BRUSHING ? ?Patient Location: Endoscopy Unit ? ?Anesthesia Type:General ? ?Level of Consciousness: drowsy ? ?Airway & Oxygen Therapy: Patient Spontanous Breathing ? ?Post-op Assessment: Report given to RN and Post -op Vital signs reviewed and stable ? ?Post vital signs: Reviewed and stable ? ?Last Vitals:  ?Vitals Value Taken Time  ?BP    ?Temp    ?Pulse 70   ?Resp 14   ?SpO2 99   ? ? ?Last Pain:  ?Vitals:  ? 09/09/21 1125  ?TempSrc:   ?PainSc: 0-No pain  ?   ? ?Patients Stated Pain Goal: 3 (09/09/21 1055) ? ?Complications: No notable events documented. ?

## 2021-09-09 NOTE — Anesthesia Postprocedure Evaluation (Signed)
Anesthesia Post Note ? ?Patient: Louis Campos ? ?Procedure(s) Performed: ESOPHAGOGASTRODUODENOSCOPY (EGD) WITH PROPOFOL ?BIOPSY ?ESOPHAGEAL BRUSHING ? ?Patient location during evaluation: Phase II ?Anesthesia Type: General ?Level of consciousness: awake ?Pain management: pain level controlled ?Vital Signs Assessment: post-procedure vital signs reviewed and stable ?Respiratory status: spontaneous breathing and respiratory function stable ?Cardiovascular status: blood pressure returned to baseline and stable ?Postop Assessment: no headache and no apparent nausea or vomiting ?Anesthetic complications: no ?Comments: Late entry ? ? ?No notable events documented. ? ? ?Last Vitals:  ?Vitals:  ? 09/09/21 1055 09/09/21 1137  ?BP: (!) 172/98 (!) 107/55  ?Pulse:  69  ?Resp: 12 14  ?Temp: (!) 36.4 ?C 36.4 ?C  ?SpO2: 100% 99%  ?  ?Last Pain:  ?Vitals:  ? 09/09/21 1137  ?TempSrc: Axillary  ?PainSc: 0-No pain  ? ? ?  ?  ?  ?  ?  ?  ? ?Windell Norfolk ? ? ? ? ?

## 2021-09-10 LAB — SURGICAL PATHOLOGY

## 2021-09-11 ENCOUNTER — Encounter (HOSPITAL_COMMUNITY): Payer: Self-pay | Admitting: Internal Medicine

## 2021-09-17 ENCOUNTER — Encounter: Payer: Self-pay | Admitting: Urology

## 2021-09-17 ENCOUNTER — Ambulatory Visit: Payer: Medicare HMO | Admitting: Urology

## 2021-09-17 VITALS — BP 157/79 | HR 73

## 2021-09-17 DIAGNOSIS — N138 Other obstructive and reflux uropathy: Secondary | ICD-10-CM

## 2021-09-17 DIAGNOSIS — N35912 Unspecified bulbous urethral stricture, male: Secondary | ICD-10-CM | POA: Diagnosis not present

## 2021-09-17 DIAGNOSIS — N401 Enlarged prostate with lower urinary tract symptoms: Secondary | ICD-10-CM

## 2021-09-17 DIAGNOSIS — R339 Retention of urine, unspecified: Secondary | ICD-10-CM | POA: Diagnosis not present

## 2021-09-17 LAB — URINALYSIS, ROUTINE W REFLEX MICROSCOPIC
Bilirubin, UA: NEGATIVE
Ketones, UA: NEGATIVE
Leukocytes,UA: NEGATIVE
Nitrite, UA: NEGATIVE
Protein,UA: NEGATIVE
RBC, UA: NEGATIVE
Specific Gravity, UA: >1.03 — ABNORMAL HIGH (ref 1.005–1.030)
Urobilinogen, Ur: 0.2 mg/dL (ref 0.2–1.0)
pH, UA: 5.5 (ref 5.0–7.5)

## 2021-09-17 LAB — BLADDER SCAN AMB NON-IMAGING: Scan Result: 272

## 2021-09-17 MED ORDER — CIPROFLOXACIN HCL 500 MG PO TABS
500.0000 mg | ORAL_TABLET | Freq: Once | ORAL | Status: AC
  Administered 2021-09-17: 500 mg via ORAL

## 2021-09-17 NOTE — H&P (View-Only) (Signed)
? ?Assessment: ?1. 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 BPH with obstruction/lower urinary tract symptoms   ?3. Incomplete bladder emptying   ? ? ?Plan: ?Findings on cystoscopy discussed with the patient today.  Management options for his urethral stricture discussed including office dilation, operative management with DVIU and balloon dilation, and urethroplasty.  I discussed the risk and benefits of each modality.  I also discussed the risk of recurrent stricture disease with DVIU and dilation as opposed to urethroplasty.  Following our discussion, he has elected to proceed with balloon dilation and DVIU. ?Continue silodosin 8 mg daily.  ?Free and total PSA today ?Cipro x1 following cystoscopy ? ?Procedure: ?The patient will be scheduled for cystoscopy, balloon dilation of urethral stricture, possible DVIU at [place].  Surgical request is placed with the surgery schedulers and will be scheduled at the patient's/family request. Informed consent is given as documented below. ?Anesthesia: General ? ?The patient does not have sleep apnea, history of MRSA, history of VRE, history of cardiac device requiring special anesthetic needs. ?Patient is stable and considered clear for surgical in an outpatient ambulatory surgery setting as well as patient hospital setting. ? ?Consent for Operation or Procedure: Provider Certification ?I hereby certify that the nature, purpose, benefits, usual and most frequent risks of, and alternatives to, the operation or procedure have been explained to the patient (or person authorized to sign for the patient) either by me as responsible physician or by the provider who is to perform the operation or procedure. Time spent such that the patient/family has had an opportunity to ask questions, and that those questions have been answered. The patient or the patient's representative has been advised that selected tasks may be performed by assistants to the  primary health care provider(s). I believe that the patient (or person authorized to sign for the patient) understands what has been explained, and has consented to the operation or procedure. No guarantees were implied or made. ? ? ? ?Chief Complaint:  ?Chief Complaint  ?Patient presents with  ? Benign Prostatic Hypertrophy  ? ? ?History of Present Illness: ? ?Louis Campos is a 70 y.o. year old male who is seen for further evaluation of lower urinary tract symptoms.  He has a history of UTIs approximately once a year for several years beginning in 2014.  These resolved spontaneously.  About 1 year ago,  he noted foul-smelling and cloudy urine.  He was tested for UTI and by report his culture was negative.  He has continued to have foul-smelling and cloudy urine.  He also reports urinary urgency at times, slight discomfort with voiding, weak stream, and nocturia 1-2 times. ?No gross hematuria or flank pain. ?IPSS = 13.  PVR = 350 mL. ?CT renal stone study from 07/11/2021 showed a small nonobstructing right renal stone, small calculus in the bladder, no evidence of renal mass or obstruction. ?Urine culture from 2/23 grew 25-50K staph haemolyticus. Treated with macrobid. ? ?At his return visit in March 2023, he continued on alfuzosin.  He noted an improvement in the color and odor of his urine.  He continued to have a decreased stream, frequency, and sensation of incomplete emptying.  No dysuria or gross hematuria. ?IPSS = 15.  PVR = 330 ml. ?He was changed to silodosin 8 mg daily. ? ?He returns today for follow-up.  He continues on silodosin.  He has not seen any significant change in his urinary symptoms.  He continues to have a   weak stream, sensation of incomplete emptying, frequency and urgency. ?IPSS = 17 today. ? ? ?Portions of the above documentation were copied from a prior visit for review purposes only. ? ?Past Medical History:  ?Past Medical History:  ?Diagnosis Date  ? Acid reflux   ? patient denies   ? ? ?Past Surgical History:  ?Past Surgical History:  ?Procedure Laterality Date  ? BIOPSY  09/09/2021  ? Procedure: BIOPSY;  Surgeon: Carver, Charles K, DO;  Location: [facility];  Service: Endoscopy;;  ? ESOPHAGEAL BRUSHING  09/09/2021  ? Procedure: ESOPHAGEAL BRUSHING;  Surgeon: Carver, Charles K, DO;  Location: [facility];  Service: Endoscopy;;  ? ESOPHAGOGASTRODUODENOSCOPY (EGD) WITH PROPOFOL N/A 09/09/2021  ? Procedure: ESOPHAGOGASTRODUODENOSCOPY (EGD) WITH PROPOFOL;  Surgeon: Carver, Charles K, DO;  Location: [facility];  Service: Endoscopy;  Laterality: N/A;  2:15pm  ? HAND SURGERY    ? right  ? ? ?Allergies:  ?No Known Allergies ? ?Family History:  ?Family History  ?Problem Relation Age of Onset  ? Colon cancer Neg Hx   ? Esophageal cancer Neg Hx   ? ? ?Social History:  ?Social History  ? ?Tobacco Use  ? Smoking status: Former  ?  Types: Cigarettes  ?  Quit date: 2000  ?  Years since quitting: 23.3  ? Smokeless tobacco: Never  ?Vaping Use  ? Vaping Use: Never used  ?Substance Use Topics  ? Alcohol use: Yes  ?  Comment: couple of shots every week.  ? Drug use: Not Currently  ? ? ?ROS: ?Constitutional:  Negative for fever, chills, weight loss ?CV: Negative for chest pain, previous MI, hypertension ?Respiratory:  Negative for shortness of breath, wheezing, sleep apnea, frequent cough ?GI:  Negative for nausea, vomiting, bloody stool, GERD ? ?Physical exam: ?BP (!) 157/79   Pulse 73  ?GENERAL APPEARANCE:  Well appearing, well developed, well nourished, NAD ?HEENT:  Atraumatic, normocephalic, oropharynx clear ?NECK:  Supple without lymphadenopathy or thyromegaly ?ABDOMEN:  Soft, non-tender, no masses ?EXTREMITIES:  Moves all extremities well, without clubbing, cyanosis, or edema ?NEUROLOGIC:  Alert and oriented x 3, normal gait, CN II-XII grossly intact ?MENTAL STATUS:  appropriate ?BACK:  Non-tender to palpation, No CVAT ?SKIN:  Warm, dry, and intact ? ? ?Results: ?U/A dipstick negative ? ?PVR: 272  mL ? ? ?Procedure:  Flexible Cystourethroscopy ? ?Pre-operative Diagnosis: Bladder outlet obstruction ? ?Post-operative Diagnosis: Bladder outlet obstruction ? ?Anesthesia:  local with lidocaine jelly ? ?Surgical Narrative: ? ?After appropriate informed consent was obtained, the patient was prepped and draped in the usual sterile fashion in the supine position.  The patient was correctly identified and the proper procedure delineated prior to proceeding.  Sterile lidocaine gel was instilled in the urethra. ?The flexible cystoscope was introduced without difficulty. ? ?Findings: ? ?Anterior urethra:  narrow stricture in bulbar urethra, unable to pass scope through area ? ?Posterior urethra:  not visualized ? ?Bladder:  not visualized ? ?Ureteral orifices:  not seen ? ?Additional findings: none ? ?Saline bladder wash for cytology was not performed.   ? ?The cystoscope was then removed.  The patient tolerated the procedure well. ? ? ?

## 2021-09-17 NOTE — Progress Notes (Signed)
? ?Assessment: ?1. 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 BPH with obstruction/lower urinary tract symptoms   ?3. Incomplete bladder emptying   ? ? ?Plan: ?Findings on cystoscopy discussed with the patient today.  Management options for his urethral stricture discussed including office dilation, operative management with DVIU and balloon dilation, and urethroplasty.  I discussed the risk and benefits of each modality.  I also discussed the risk of recurrent stricture disease with DVIU and dilation as opposed to urethroplasty.  Following our discussion, he has elected to proceed with balloon dilation and DVIU. ?Continue silodosin 8 mg daily.  ?Free and total PSA today ?Cipro x1 following cystoscopy ? ?Procedure: ?The patient will be scheduled for cystoscopy, balloon dilation of urethral stricture, possible DVIU at [facility].  Surgical request is placed with the surgery schedulers and will be scheduled at the patient's/family request. Informed consent is given as documented below. ?Anesthesia: General ? ?The patient does not have sleep apnea, history of MRSA, history of VRE, history of cardiac device requiring special anesthetic needs. ?Patient is stable and considered clear for surgical in an outpatient ambulatory surgery setting as well as patient hospital setting. ? ?Consent for Operation or Procedure: Provider Certification ?I hereby certify that the nature, purpose, benefits, usual and most frequent risks of, and alternatives to, the operation or procedure have been explained to the patient (or person authorized to sign for the patient) either by me as responsible physician or by the provider who is to perform the operation or procedure. Time spent such that the patient/family has had an opportunity to ask questions, and that those questions have been answered. The patient or the patient's representative has been advised that selected tasks may be performed by assistants to the  primary health care provider(s). I believe that the patient (or person authorized to sign for the patient) understands what has been explained, and has consented to the operation or procedure. No guarantees were implied or made. ? ? ? ?Chief Complaint:  ?Chief Complaint  ?Patient presents with  ? Benign Prostatic Hypertrophy  ? ? ?History of Present Illness: ? ?Louis Campos is a 71 y.o. year old male who is seen for further evaluation of lower urinary tract symptoms.  He has a history of UTIs approximately once a year for several years beginning in 2014.  These resolved spontaneously.  About 1 year ago,  he noted foul-smelling and cloudy urine.  He was tested for UTI and by report his culture was negative.  He has continued to have foul-smelling and cloudy urine.  He also reports urinary urgency at times, slight discomfort with voiding, weak stream, and nocturia 1-2 times. ?No gross hematuria or flank pain. ?IPSS = 13.  PVR = 350 mL. ?CT renal stone study from 07/11/2021 showed a small nonobstructing right renal stone, small calculus in the bladder, no evidence of renal mass or obstruction. ?Urine culture from 2/23 grew 25-50K staph haemolyticus. Treated with macrobid. ? ?At his return visit in March 2023, he continued on alfuzosin.  He noted an improvement in the color and odor of his urine.  He continued to have a decreased stream, frequency, and sensation of incomplete emptying.  No dysuria or gross hematuria. ?IPSS = 15.  PVR = 330 ml. ?He was changed to silodosin 8 mg daily. ? ?He returns today for follow-up.  He continues on silodosin.  He has not seen any significant change in his urinary symptoms.  He continues to have a  weak stream, sensation of incomplete emptying, frequency and urgency. ?IPSS = 17 today. ? ? ?Portions of the above documentation were copied from a prior visit for review purposes only. ? ?Past Medical History:  ?Past Medical History:  ?Diagnosis Date  ? Acid reflux   ? patient denies   ? ? ?Past Surgical History:  ?Past Surgical History:  ?Procedure Laterality Date  ? BIOPSY  09/09/2021  ? Procedure: BIOPSY;  Surgeon: Eloise Harman, DO;  Location: [facility];  Service: Endoscopy;;  ? ESOPHAGEAL BRUSHING  09/09/2021  ? Procedure: ESOPHAGEAL BRUSHING;  Surgeon: Eloise Harman, DO;  Location: [facility];  Service: Endoscopy;;  ? ESOPHAGOGASTRODUODENOSCOPY (EGD) WITH PROPOFOL N/A 09/09/2021  ? Procedure: ESOPHAGOGASTRODUODENOSCOPY (EGD) WITH PROPOFOL;  Surgeon: Eloise Harman, DO;  Location: [facility];  Service: Endoscopy;  Laterality: N/A;  2:15pm  ? HAND SURGERY    ? right  ? ? ?Allergies:  ?No Known Allergies ? ?Family History:  ?Family History  ?Problem Relation Age of Onset  ? Colon cancer Neg Hx   ? Esophageal cancer Neg Hx   ? ? ?Social History:  ?Social History  ? ?Tobacco Use  ? Smoking status: Former  ?  Types: Cigarettes  ?  Quit date: 2000  ?  Years since quitting: 23.3  ? Smokeless tobacco: Never  ?Vaping Use  ? Vaping Use: Never used  ?Substance Use Topics  ? Alcohol use: Yes  ?  Comment: couple of shots every week.  ? Drug use: Not Currently  ? ? ?ROS: ?Constitutional:  Negative for fever, chills, weight loss ?CV: Negative for chest pain, previous MI, hypertension ?Respiratory:  Negative for shortness of breath, wheezing, sleep apnea, frequent cough ?GI:  Negative for nausea, vomiting, bloody stool, GERD ? ?Physical exam: ?BP (!) 157/79   Pulse 73  ?GENERAL APPEARANCE:  Well appearing, well developed, well nourished, NAD ?HEENT:  Atraumatic, normocephalic, oropharynx clear ?NECK:  Supple without lymphadenopathy or thyromegaly ?ABDOMEN:  Soft, non-tender, no masses ?EXTREMITIES:  Moves all extremities well, without clubbing, cyanosis, or edema ?NEUROLOGIC:  Alert and oriented x 3, normal gait, CN II-XII grossly intact ?MENTAL STATUS:  appropriate ?BACK:  Non-tender to palpation, No CVAT ?SKIN:  Warm, dry, and intact ? ? ?Results: ?U/A dipstick negative ? ?PVR: 272  mL ? ? ?Procedure:  Flexible Cystourethroscopy ? ?Pre-operative Diagnosis: Bladder outlet obstruction ? ?Post-operative Diagnosis: Bladder outlet obstruction ? ?Anesthesia:  local with lidocaine jelly ? ?Surgical Narrative: ? ?After appropriate informed consent was obtained, the patient was prepped and draped in the usual sterile fashion in the supine position.  The patient was correctly identified and the proper procedure delineated prior to proceeding.  Sterile lidocaine gel was instilled in the urethra. ?The flexible cystoscope was introduced without difficulty. ? ?Findings: ? ?Anterior urethra:  narrow stricture in bulbar urethra, unable to pass scope through area ? ?Posterior urethra:  not visualized ? ?Bladder:  not visualized ? ?Ureteral orifices:  not seen ? ?Additional findings: none ? ?Saline bladder wash for cytology was not performed.   ? ?The cystoscope was then removed.  The patient tolerated the procedure well. ? ? ?

## 2021-09-19 NOTE — Progress Notes (Signed)
I spoke with patients wife. We have discussed possible surgery dates and 10/08/2021 was agreed upon by all parties. Patient given information about surgery date, what to expect pre-operatively and post operatively.  ?  ?We discussed that a pre-op nurse will be calling to set up the pre-op visit that will take place prior to surgery. Informed patient that our office will communicate any additional care to be provided after surgery.  ?  ?Patients questions or concerns were discussed during our call. Advised to call our office should there be any additional information, questions or concerns that arise. Patient verbalized understanding.   ?

## 2021-09-24 LAB — PSA, TOTAL AND FREE: Prostate Specific Ag, Serum: 4 ng/mL (ref 0.0–4.0)

## 2021-10-02 ENCOUNTER — Telehealth: Payer: Self-pay | Admitting: Internal Medicine

## 2021-10-02 MED ORDER — FLUCONAZOLE 200 MG PO TABS: ORAL_TABLET | ORAL | 0 refills | Status: DC

## 2021-10-02 NOTE — Telephone Encounter (Signed)
I have been unable to get a hold of patient.  Please try to get in contact with him.  Esophageal biopsy showed Barrett's esophagus.  We will continue to monitor this.  Continue on pantoprazole. ? ?Esophageal sampling did show yeast consistent with candidal esophagitis.  I will send in 14-day course of Diflucan to take if he is still having difficulty swallowing. ? ?Follow-up as previously scheduled.  Thank you ?

## 2021-10-02 NOTE — Telephone Encounter (Signed)
Phoned and advised the pt of his result note and instructions to medications phoned in to his pharmacy. Pt to follow up as previously scheduled. Pt expressed understanding of all these things ?

## 2021-10-03 NOTE — Patient Instructions (Signed)
? ? ? ? Brilyn Kise ? 10/03/2021  ?  ? @PREFPERIOPPHARMACY @ ? ? Your procedure is scheduled on  10/08/2021. ? ? Report to [facility] at  0900  A.M. ? ? Call this number if you have problems the morning of surgery: ? [phone] ? ? Remember: ? Do not eat or drink after midnight. ?  ?  ? Take these medicines the morning of surgery with A SIP OF WATER  ? ?                                protonix, rapaflo. ?  ? Do not wear jewelry, make-up or nail polish. ? Do not wear lotions, powders, or perfumes, or deodorant. ? Do not shave 48 hours prior to surgery.  Men may shave face and neck. ? Do not bring valuables to the hospital. ? [facility] is not responsible for any belongings or valuables. ? ?Contacts, dentures or bridgework may not be worn into surgery.  Leave your suitcase in the car.  After surgery it may be brought to your room. ? ?For patients admitted to the hospital, discharge time will be determined by your treatment team. ? ?Patients discharged the day of surgery will not be allowed to drive home and must have someone with them for 24 hours.  ? ? ?Special instructions:   DO NOT smoke tobacco or vape for 24 hours before your procedure. ? ?Please read over the following fact sheets that you were given. ?Anesthesia Post-op Instructions and Care and Recovery After Surgery ?  ? ? ? Cystoscopy ?Cystoscopy is a procedure that is used to help diagnose and sometimes treat conditions that affect the lower urinary tract. The lower urinary tract includes the bladder and the urethra. The urethra is the tube that drains urine from the bladder. Cystoscopy is done using a thin, tube-shaped instrument with a light and camera at the end (cystoscope). The cystoscope may be hard or flexible, depending on the goal of the procedure. The cystoscope is inserted through the urethra, into the bladder. ?Cystoscopy may be recommended if you have: ?Urinary tract infections that keep coming back. ?Blood in the urine (hematuria). ?An  inability to control when you urinate (urinary incontinence) or an overactive bladder. ?Unusual cells found in a urine sample. ?A blockage in the urethra, such as a urinary stone. ?Painful urination. ?An abnormality in the bladder found during an intravenous pyelogram (IVP) or CT scan. ?Cystoscopy may also be done to remove a sample of tissue to be examined under a microscope (biopsy). ?Tell a health care provider about: ?Any allergies you have. ?All medicines you are taking, including vitamins, herbs, eye drops, creams, and over-the-counter medicines. ?Any problems you or family members have had with anesthetic medicines. ?Any blood disorders you have. ?Any surgeries you have had. ?Any medical conditions you have. ?Whether you are pregnant or may be pregnant. ?What are the risks? ?Generally, this is a safe procedure. However, problems may occur, including: ?Infection. ?Bleeding. ?Allergic reactions to medicines. ?Damage to other structures or organs. ?What happens before the procedure? ?Medicines ?Ask your health care provider about: ?Changing or stopping your regular medicines. This is especially important if you are taking diabetes medicines or blood thinners. ?Taking medicines such as aspirin and ibuprofen. These medicines can thin your blood. Do not take these medicines unless your health care provider tells you to take them. ?Taking over-the-counter medicines, vitamins, herbs, and supplements. ?Tests ?You  may have an exam or testing, such as: ?X-rays of the bladder, urethra, or kidneys. ?CT scan of the abdomen or pelvis. ?Urine tests to check for signs of infection. ?General instructions ?Follow instructions from your health care provider about eating or drinking restrictions. ?Ask your health care provider what steps will be taken to help prevent infection. These steps may include: ?Washing skin with a germ-killing soap. ?Taking antibiotic medicine. ?Plan to have a responsible adult take you home from the  hospital or clinic. ?What happens during the procedure? ? ?You will be given one or more of the following: ?A medicine to help you relax (sedative). ?A medicine to numb the area (local anesthetic). ?The area around the opening of your urethra will be cleaned. ?The cystoscope will be passed through your urethra into your bladder. ?Germ-free (sterile) fluid will flow through the cystoscope to fill your bladder. The fluid will stretch your bladder so that your health care provider can clearly examine your bladder walls. ?Your doctor will look at the urethra and bladder. Your doctor may take a biopsy or remove stones. ?The cystoscope will be removed, and your bladder will be emptied. ?The procedure may vary among health care providers and hospitals. ?What can I expect after the procedure? ?After the procedure, it is common to have: ?Some soreness or pain in your abdomen and urethra. ?Urinary symptoms. These include: ?Mild pain or burning when you urinate. Pain should stop within a few minutes after you urinate. This may last for up to 1 week. ?A small amount of blood in your urine for several days. ?Feeling like you need to urinate but producing only a small amount of urine. ?Follow these instructions at home: ?Medicines ?Take over-the-counter and prescription medicines only as told by your health care provider. ?If you were prescribed an antibiotic medicine, take it as told by your health care provider. Do not stop taking the antibiotic even if you start to feel better. ?General instructions ?Return to your normal activities as told by your health care provider. Ask your health care provider what activities are safe for you. ?If you were given a sedative during the procedure, it can affect you for several hours. Do not drive or operate machinery until your health care provider says that it is safe. ?Watch for any blood in your urine. If the amount of blood in your urine increases, call your health care  provider. ?Follow instructions from your health care provider about eating or drinking restrictions. ?If a tissue sample was removed for testing (biopsy) during your procedure, it is up to you to get your test results. Ask your health care provider, or the department that is doing the test, when your results will be ready. ?Drink enough fluid to keep your urine pale yellow. ?Keep all follow-up visits. This is important. ?Contact a health care provider if: ?You have pain that gets worse or does not get better with medicine, especially pain when you urinate. ?You have trouble urinating. ?You have more blood in your urine. ?Get help right away if: ?You have blood clots in your urine. ?You have abdominal pain. ?You have a fever or chills. ?You are unable to urinate. ?Summary ?Cystoscopy is a procedure that is used to help diagnose and sometimes treat conditions that affect the lower urinary tract. ?Cystoscopy is done using a thin, tube-shaped instrument with a light and camera at the end. ?After the procedure, it is common to have some soreness or pain in your abdomen and urethra. ?Watch for  any blood in your urine. If the amount of blood in your urine increases, call your health care provider. ?If you were prescribed an antibiotic medicine, take it as told by your health care provider. Do not stop taking the antibiotic even if you start to feel better. ?This information is not intended to replace advice given to you by your health care provider. Make sure you discuss any questions you have with your health care provider. ?Document Revised: 01/22/2021 Document Reviewed: 12/22/2019 ?Elsevier Patient Education ? [employer]. ?How to Use Chlorhexidine for Bathing ?Chlorhexidine gluconate (CHG) is a germ-killing (antiseptic) solution that is used to clean the skin. It can get rid of the bacteria that normally live on the skin and can keep them away for about 24 hours. To clean your skin with CHG, you may be given: ?A  CHG solution to use in the shower or as part of a sponge bath. ?A prepackaged cloth that contains CHG. ?Cleaning your skin with CHG may help lower the risk for infection: ?While you are staying in the intensive care unit of

## 2021-10-06 ENCOUNTER — Other Ambulatory Visit: Payer: Self-pay

## 2021-10-06 ENCOUNTER — Encounter (HOSPITAL_COMMUNITY): Payer: Self-pay

## 2021-10-06 ENCOUNTER — Encounter (HOSPITAL_COMMUNITY)
Admission: RE | Admit: 2021-10-06 | Discharge: 2021-10-06 | Disposition: A | Discharge status: Home / Self Care | Payer: Medicare HMO | Source: Ambulatory Visit | Attending: Urology | Admitting: Urology

## 2021-10-08 ENCOUNTER — Ambulatory Visit (HOSPITAL_BASED_OUTPATIENT_CLINIC_OR_DEPARTMENT_OTHER): Payer: Medicare HMO | Admitting: Anesthesiology

## 2021-10-08 ENCOUNTER — Ambulatory Visit (HOSPITAL_COMMUNITY): Payer: Medicare HMO | Admitting: Anesthesiology

## 2021-10-08 ENCOUNTER — Ambulatory Visit (HOSPITAL_COMMUNITY): Payer: Medicare HMO

## 2021-10-08 ENCOUNTER — Ambulatory Visit (HOSPITAL_COMMUNITY)
Admission: RE | Admit: 2021-10-08 | Discharge: 2021-10-08 | Disposition: A | Discharge status: Home / Self Care | Payer: Medicare HMO | Attending: Urology | Admitting: Urology

## 2021-10-08 ENCOUNTER — Encounter (HOSPITAL_COMMUNITY): Payer: Self-pay | Admitting: Urology

## 2021-10-08 ENCOUNTER — Encounter (HOSPITAL_COMMUNITY)
Admission: RE | Disposition: A | Discharge status: Home / Self Care | Payer: Self-pay | Source: Home / Self Care | Attending: Urology

## 2021-10-08 DIAGNOSIS — Z87440 Personal history of urinary (tract) infections: Secondary | ICD-10-CM | POA: Diagnosis not present

## 2021-10-08 DIAGNOSIS — Z87891 Personal history of nicotine dependence: Secondary | ICD-10-CM | POA: Diagnosis not present

## 2021-10-08 DIAGNOSIS — N3289 Other specified disorders of bladder: Secondary | ICD-10-CM | POA: Diagnosis not present

## 2021-10-08 DIAGNOSIS — R3914 Feeling of incomplete bladder emptying: Secondary | ICD-10-CM | POA: Diagnosis not present

## 2021-10-08 DIAGNOSIS — N200 Calculus of kidney: Secondary | ICD-10-CM

## 2021-10-08 DIAGNOSIS — N138 Other obstructive and reflux uropathy: Secondary | ICD-10-CM | POA: Insufficient documentation

## 2021-10-08 DIAGNOSIS — Z1211 Encounter for screening for malignant neoplasm of colon: Secondary | ICD-10-CM

## 2021-10-08 DIAGNOSIS — N401 Enlarged prostate with lower urinary tract symptoms: Secondary | ICD-10-CM | POA: Diagnosis not present

## 2021-10-08 DIAGNOSIS — K219 Gastro-esophageal reflux disease without esophagitis: Secondary | ICD-10-CM | POA: Diagnosis not present

## 2021-10-08 DIAGNOSIS — R339 Retention of urine, unspecified: Secondary | ICD-10-CM

## 2021-10-08 DIAGNOSIS — N35912 Unspecified bulbous urethral stricture, male: Secondary | ICD-10-CM | POA: Insufficient documentation

## 2021-10-08 DIAGNOSIS — R1310 Dysphagia, unspecified: Secondary | ICD-10-CM

## 2021-10-08 HISTORY — PX: CYSTOSCOPY WITH URETHRAL DILATATION: SHX5125

## 2021-10-08 SURGERY — CYSTOSCOPY, WITH URETHRAL DILATION
Anesthesia: General | Site: Urethra

## 2021-10-08 MED ORDER — CHLORHEXIDINE GLUCONATE 0.12 % MT SOLN
15.0000 mL | Freq: Once | OROMUCOSAL | Status: AC
  Administered 2021-10-08: 15 mL via OROMUCOSAL

## 2021-10-08 MED ORDER — WATER FOR IRRIGATION, STERILE IR SOLN
Status: DC | PRN
  Administered 2021-10-08: 500 mL

## 2021-10-08 MED ORDER — MIDAZOLAM HCL 2 MG/2ML IJ SOLN
INTRAMUSCULAR | Status: AC
Start: 2021-10-08 — End: ?
  Filled 2021-10-08: qty 2

## 2021-10-08 MED ORDER — FENTANYL CITRATE (PF) 100 MCG/2ML IJ SOLN
INTRAMUSCULAR | Status: DC | PRN
  Administered 2021-10-08 (×2): 25 ug via INTRAVENOUS
  Administered 2021-10-08: 50 ug via INTRAVENOUS

## 2021-10-08 MED ORDER — CEFAZOLIN SODIUM-DEXTROSE 2-4 GM/100ML-% IV SOLN
2.0000 g | INTRAVENOUS | Status: AC
  Administered 2021-10-08: 2 g via INTRAVENOUS
  Filled 2021-10-08: qty 100

## 2021-10-08 MED ORDER — PROPOFOL 500 MG/50ML IV EMUL
INTRAVENOUS | Status: DC | PRN
  Administered 2021-10-08: 50 ug/kg/min via INTRAVENOUS

## 2021-10-08 MED ORDER — DIATRIZOATE MEGLUMINE 30 % UR SOLN
URETHRAL | Status: DC | PRN
  Administered 2021-10-08: 20 mL via URETHRAL

## 2021-10-08 MED ORDER — HYDROMORPHONE HCL 1 MG/ML IJ SOLN: 0.2500 mg | INTRAMUSCULAR | Status: DC | PRN

## 2021-10-08 MED ORDER — ORAL CARE MOUTH RINSE: 15.0000 mL | Freq: Once | OROMUCOSAL | Status: AC

## 2021-10-08 MED ORDER — DIATRIZOATE MEGLUMINE 30 % UR SOLN
URETHRAL | Status: AC
  Filled 2021-10-08: qty 100

## 2021-10-08 MED ORDER — LIDOCAINE HCL (CARDIAC) PF 100 MG/5ML IV SOSY
PREFILLED_SYRINGE | INTRAVENOUS | Status: DC | PRN
  Administered 2021-10-08: 60 mg via INTRATRACHEAL

## 2021-10-08 MED ORDER — SULFAMETHOXAZOLE-TRIMETHOPRIM 800-160 MG PO TABS: 1.0000 | ORAL_TABLET | Freq: Two times a day (BID) | ORAL | 0 refills | Status: AC

## 2021-10-08 MED ORDER — SODIUM CHLORIDE 0.9 % IR SOLN
Status: DC | PRN
  Administered 2021-10-08: 3000 mL

## 2021-10-08 MED ORDER — LACTATED RINGERS IV SOLN
INTRAVENOUS | Status: DC
  Administered 2021-10-08: 1000 mL via INTRAVENOUS

## 2021-10-08 MED ORDER — PROPOFOL 10 MG/ML IV BOLUS
INTRAVENOUS | Status: AC
  Filled 2021-10-08: qty 20

## 2021-10-08 MED ORDER — MIDAZOLAM HCL 2 MG/2ML IJ SOLN
INTRAMUSCULAR | Status: DC | PRN
  Administered 2021-10-08: 2 mg via INTRAVENOUS

## 2021-10-08 MED ORDER — PROPOFOL 10 MG/ML IV BOLUS
INTRAVENOUS | Status: DC | PRN
  Administered 2021-10-08: 40 mg via INTRAVENOUS

## 2021-10-08 MED ORDER — FENTANYL CITRATE (PF) 100 MCG/2ML IJ SOLN
INTRAMUSCULAR | Status: AC
  Filled 2021-10-08: qty 2

## 2021-10-08 MED ORDER — MEPERIDINE HCL 50 MG/ML IJ SOLN: 6.2500 mg | INTRAMUSCULAR | Status: DC | PRN

## 2021-10-08 MED ORDER — ONDANSETRON HCL 4 MG/2ML IJ SOLN: 4.0000 mg | Freq: Once | INTRAMUSCULAR | Status: DC | PRN

## 2021-10-08 MED ORDER — PHENAZOPYRIDINE HCL 200 MG PO TABS: 200.0000 mg | ORAL_TABLET | Freq: Three times a day (TID) | ORAL | 1 refills | Status: DC | PRN

## 2021-10-08 SURGICAL SUPPLY — 30 items
BAG DRAIN URO TABLE W/ADPT NS (BAG) ×3 IMPLANT
BAG DRN 8 ADPR NS SKTRN CSTL (BAG) ×2
BAG DRN RND TRDRP ANRFLXCHMBR (UROLOGICAL SUPPLIES) ×2
BAG HAMPER (MISCELLANEOUS) ×3 IMPLANT
BAG URINE DRAIN 2000ML AR STRL (UROLOGICAL SUPPLIES) ×3 IMPLANT
BALLN NEPHROSTOMY (BALLOONS) ×3
BALLN OPTILUME DCB 30X5X75 (BALLOONS) ×3
BALLOON NEPHROSTOMY (BALLOONS) ×2 IMPLANT
BALLOON OPTILUME DCB 30X5X75 (BALLOONS) ×1 IMPLANT
CATH FOLEY 2W COUNCIL 5CC 16FR (CATHETERS) ×2 IMPLANT
CATH FOLEY 2WAY SLVR  5CC 18FR (CATHETERS) ×3
CATH FOLEY 2WAY SLVR 5CC 18FR (CATHETERS) ×2 IMPLANT
CATH URET 5FR 28IN OPEN ENDED (CATHETERS) ×2 IMPLANT
CLOTH BEACON ORANGE TIMEOUT ST (SAFETY) ×3 IMPLANT
COVER MAYO STAND XLG (MISCELLANEOUS) ×2 IMPLANT
GLOVE BIO SURGEON STRL SZ8 (GLOVE) ×3 IMPLANT
GLOVE BIOGEL PI IND STRL 7.0 (GLOVE) ×4 IMPLANT
GLOVE BIOGEL PI INDICATOR 7.0 (GLOVE) ×2
GOWN STRL REUS W/TWL LRG LVL3 (GOWN DISPOSABLE) ×3 IMPLANT
GOWN STRL REUS W/TWL XL LVL3 (GOWN DISPOSABLE) ×3 IMPLANT
GUIDEWIRE STR DUAL SENSOR (WIRE) ×3 IMPLANT
IV NS IRRIG 3000ML ARTHROMATIC (IV SOLUTION) ×4 IMPLANT
KIT TURNOVER CYSTO (KITS) ×3 IMPLANT
MANIFOLD NEPTUNE II (INSTRUMENTS) ×3 IMPLANT
PACK CYSTO (CUSTOM PROCEDURE TRAY) ×3 IMPLANT
PAD ARMBOARD 7.5X6 YLW CONV (MISCELLANEOUS) ×3 IMPLANT
SYR 20ML LL LF (SYRINGE) ×2 IMPLANT
SYR BULB IRRIG 60ML STRL (SYRINGE) ×3 IMPLANT
TOWEL NATURAL 4PK STERILE (DISPOSABLE) ×3 IMPLANT
WATER STERILE IRR 500ML POUR (IV SOLUTION) ×3 IMPLANT

## 2021-10-08 NOTE — Anesthesia Postprocedure Evaluation (Signed)
Anesthesia Post Note ? ?Patient: Louis Campos ? ?Procedure(s) Performed: CYSTOSCOPY WITH URETHRAL DILATATION- balloon dilatation with OPTILUME (Urethra) ? ?Patient location during evaluation: Phase II ?Anesthesia Type: General ?Level of consciousness: awake and alert and oriented ?Pain management: pain level controlled ?Vital Signs Assessment: post-procedure vital signs reviewed and stable ?Respiratory status: spontaneous breathing, nonlabored ventilation and respiratory function stable ?Cardiovascular status: blood pressure returned to baseline and stable ?Postop Assessment: no apparent nausea or vomiting ?Anesthetic complications: no ? ? ?No notable events documented. ? ? ?Last Vitals:  ?Vitals:  ? 10/08/21 0936 10/08/21 1147  ?BP: (!) 159/91 (!) 137/96  ?Pulse:  63  ?Resp: 16 17  ?Temp: 36.4 ?C 37.1 ?C  ?SpO2: 99% 100%  ?  ?Last Pain:  ?Vitals:  ? 10/08/21 1147  ?TempSrc: Oral  ?PainSc: 0-No pain  ? ? ?  ?  ?  ?  ?  ?  ? ?Ariani Seier C Kaesen Rodriguez ? ? ? ? ?

## 2021-10-08 NOTE — Anesthesia Procedure Notes (Signed)
Date/Time: 10/08/2021 10:51 AM ?Performed by: Lorin Glass, CRNA ?Pre-anesthesia Checklist: Patient identified, Emergency Drugs available, Suction available and Patient being monitored ?Oxygen Delivery Method: Nasal cannula ? ? ? ? ?

## 2021-10-08 NOTE — Interval H&P Note (Signed)
History and Physical Interval Note: ? ?10/08/2021 ?10:12 AM ? ?Louis Campos  has presented today for surgery, with the diagnosis of uretheral stricture.  The various methods of treatment have been discussed with the patient and family. After consideration of risks, benefits and other options for treatment, the patient has consented to  Procedure(s): ?CYSTOSCOPY WITH URETHRAL DILATATION- balloon dilatation (N/A) ?CYSTOSCOPY WITH DIRECT VISION INTERNAL URETHROTOMY (N/A) as a surgical intervention.  The patient's history has been reviewed, patient examined, no change in status, stable for surgery.  I have reviewed the patient's chart and labs.  Questions were answered to the patient's satisfaction.   ? ?The patient is scheduled to undergo balloon dilation with a urethral drug-coated balloon (OptiLume).  I discussed this procedure as well as potential risk and benefits.  I reviewed the potential benefits of decreased recurrence of urethral stricture with use of the drug-coated balloon.  Potential risk reviewed and instructions for postoperative management discussed.  He understands and agrees to proceed. ? ? ?Elige Radon Abimbola Aki ? ? ?

## 2021-10-08 NOTE — Anesthesia Preprocedure Evaluation (Addendum)
Anesthesia Evaluation  ?Patient identified by MRN, date of birth, ID band ?Patient awake ? ? ? ?Reviewed: ?Allergy & Precautions, NPO status , Patient's Chart, lab work & pertinent test results ? ?Airway ?Mallampati: II ? ?TM Distance: >3 FB ?Neck ROM: Full ? ? ? Dental ? ?(+) Dental Advisory Given, Missing ?  ?Pulmonary ?former smoker,  ?  ?Pulmonary exam normal ?breath sounds clear to auscultation ? ? ? ? ? ? Cardiovascular ?negative cardio ROS ?Normal cardiovascular exam ?Rhythm:Regular Rate:Normal ? ? ?  ?Neuro/Psych ?negative neurological ROS ? negative psych ROS  ? GI/Hepatic ?Neg liver ROS, GERD  Medicated and Controlled,  ?Endo/Other  ?negative endocrine ROS ? Renal/GU ?Renal disease  ?negative genitourinary ?  ?Musculoskeletal ?negative musculoskeletal ROS ?(+)  ? Abdominal ?  ?Peds ?negative pediatric ROS ?(+)  Hematology ?negative hematology ROS ?(+)   ?Anesthesia Other Findings ? ? Reproductive/Obstetrics ?negative OB ROS ? ?  ? ? ? ? ? ? ? ? ? ? ? ? ? ?  ?  ? ? ? ? ? ? ? ?Anesthesia Physical ?Anesthesia Plan ? ?ASA: 2 ? ?Anesthesia Plan: General  ? ?Post-op Pain Management: Dilaudid IV  ? ?Induction: Intravenous ? ?PONV Risk Score and Plan: 4 or greater and Ondansetron and Dexamethasone ? ?Airway Management Planned: LMA ? ?Additional Equipment:  ? ?Intra-op Plan:  ? ?Post-operative Plan:  ? ?Informed Consent: I have reviewed the patients History and Physical, chart, labs and discussed the procedure including the risks, benefits and alternatives for the proposed anesthesia with the patient or authorized representative who has indicated his/her understanding and acceptance.  ? ? ? ?Dental advisory given ? ?Plan Discussed with: CRNA and Surgeon ? ?Anesthesia Plan Comments:   ? ? ? ? ? ?Anesthesia Quick Evaluation ? ?

## 2021-10-08 NOTE — Transfer of Care (Signed)
Immediate Anesthesia Transfer of Care Note ? ?Patient: Louis Campos ? ?Procedure(s) Performed: CYSTOSCOPY WITH URETHRAL DILATATION- balloon dilatation with OPTILUME (Urethra) ? ?Patient Location: Short Stay ? ?Anesthesia Type:General ? ?Level of Consciousness: awake and alert  ? ?Airway & Oxygen Therapy: Patient Spontanous Breathing ? ?Post-op Assessment: Report given to RN and Post -op Vital signs reviewed and stable ? ?Post vital signs: Reviewed and stable ? ?Last Vitals:  ?Vitals Value Taken Time  ?BP    ?Temp    ?Pulse    ?Resp    ?SpO2    ? ? ?Last Pain:  ?Vitals:  ? 10/08/21 0936  ?TempSrc: Oral  ?PainSc: 0-No pain  ?   ? ?Patients Stated Pain Goal: 7 (10/08/21 0936) ? ?Complications: No notable events documented. ?

## 2021-10-08 NOTE — Op Note (Signed)
OPERATIVE NOTE ? ? ?Patient Name: Louis Campos ? ?MRN:  [id number] ? ?Date of Procedure: 10/08/21 ? ?Preoperative diagnosis:  ?Bulbar urethral stricture ? ?Postoperative diagnosis:  ?Bulbar urethral stricture ? ?Procedure:  ?Cystoscopy  ?Balloon dilation of bulbar urethral stricture with OptiLume balloon ? ?Attending: Primus Bravo, MD ? ?Anesthesia: MAC ? ?Estimated blood loss: 5 ml ? ?Fluids: Per anesthesia record ? ?Drains: 85 F foley ? ?Specimens: None ? ?Antibiotics: Ancef 2 g IV ? ?Findings: Tight bulbar urethral stricture; lateral lobe enlargement of the prostate with median lobe; bladder with 2+ trabeculations ? ?Indications:  ?71 year old male presents for surgical management of a anterior urethral stricture.  He has a history of lower urinary tract symptoms and has had a history of UTIs approximately once a year for several years.  He has had elevated postvoid residual.  CT imaging showed a small nonobstructing right renal stone, small calculus in the bladder, no evidence of renal mass or obstruction.  He did not see any improvement in his urinary symptoms with medical therapy with alpha blockers.  He continued to have elevated postvoid residuals.  He was evaluated with cystoscopy in April 2023.  This showed a very narrow anterior urethral stricture in the bulbar urethra which would not allow passage of the scope.  Treatment options were discussed with the patient in detail including office dilation, operative management with balloon dilation, possible DVIU, and urethroplasty.  He has elected to proceed with balloon dilation and possible DVIU.  He is scheduled for cystoscopy with balloon dilation using the Optilume drug-coated balloon.  I discussed the risk and benefits of the procedure in great detail with him.  He understands the potential risk including but not limited to infection, bleeding, injury to the urethra or bladder, recurrence of stricture, possible need for additional treatments, and  anesthetic risk.  He wishes to proceed with the operation as described. ? ?Description of Procedure:  ?The patient received IV Ancef preoperatively.  After successful induction of MAC anesthesia, the patient was placed in the lithotomy position.  The patient's genital areas prepped and draped in sterile fashion.  Under direct visualization a 21 French cystoscope was passed into the urethra.  The fossa navicularis and penile urethra were normal.  A very tight stricture was noted in the bulbar urethra.  The stricture would not allow passage of the scope.  A sensor guidewire was passed through the stricture and into the bladder under direct visualization.  Fluoroscopy was used to confirm position of the guidewire within the bladder.  A 5 French open-ended catheter was passed over the guidewire and into the bladder.  Contrast was injected and again confirmed position within the bladder.  The guidewire was left in place.  A 24 French balloon dilator was then passed over the guidewire.  Under cystoscopic and fluoroscopic guidance, the bulbar stricture was dilated to 24 [place].  A narrow area of wasting was noted prior to complete dilation.  This area measured <1 cm in length.  Following dilation, I was able to easily pass the cystoscope through the strictured area.  The patient was noted to have lateral lobe enlargement of the prostate with a median lobe and elevation of the bladder neck.  I inspected the bladder throughout its entirety.  Patient was noted to have trabeculations.  No mucosal lesions were seen.  A normal-appearing trigone was noted.  The guidewire was placed into the bladder under direct visualization.  The 30 French 5 cm Optilume balloon was placed over the  guidewire into the strictured area.  Position was confirmed with cystoscopy and fluoroscopy.  The balloon was inflated to 10 atm and remained inflated for a total of 5 minutes.  The balloon was then deflated and removed.  A 16 French council tip  catheter was then placed over the guidewire and into the bladder.  10 mils of sterile water was placed in the balloon.  The catheter irrigated easily with return of blood-tinged urine.  Irrigation was performed to ensure no clots remained.  The catheter was placed to continuous drainage.  The patient was then awakened and taken to the post anesthesia care unit in stable condition. ? ?Complications: None ? ?Condition: Stable, extubated, transferred to PACU ? ?Plan:  ?Discharge to home with Foley catheter for 5 days ?Return to the office on 10/13/2021 for voiding trial. ? ? ?

## 2021-10-09 ENCOUNTER — Encounter (HOSPITAL_COMMUNITY): Payer: Self-pay | Admitting: Urology

## 2021-10-13 ENCOUNTER — Ambulatory Visit: Payer: Medicare HMO | Admitting: Urology

## 2021-10-13 ENCOUNTER — Encounter: Payer: Self-pay | Admitting: Urology

## 2021-10-13 VITALS — BP 122/81 | HR 108

## 2021-10-13 DIAGNOSIS — N9989 Other postprocedural complications and disorders of genitourinary system: Secondary | ICD-10-CM | POA: Diagnosis not present

## 2021-10-13 DIAGNOSIS — N138 Other obstructive and reflux uropathy: Secondary | ICD-10-CM

## 2021-10-13 DIAGNOSIS — N35912 Unspecified bulbous urethral stricture, male: Secondary | ICD-10-CM | POA: Diagnosis not present

## 2021-10-13 DIAGNOSIS — N401 Enlarged prostate with lower urinary tract symptoms: Secondary | ICD-10-CM

## 2021-10-13 DIAGNOSIS — R310 Gross hematuria: Secondary | ICD-10-CM

## 2021-10-13 DIAGNOSIS — R338 Other retention of urine: Secondary | ICD-10-CM

## 2021-10-13 DIAGNOSIS — R339 Retention of urine, unspecified: Secondary | ICD-10-CM

## 2021-10-13 NOTE — Progress Notes (Signed)
Fill and Pull Catheter Removal  Patient is present today for a catheter removal.  Patient was cleaned and prepped in a sterile fashion 327ml of sterile water/ saline was instilled into the bladder when the patient felt the urge to urinate. 56ml of water was then drained from the balloon.  A 16FR foley cath was removed from the bladder no complications were noted .  Patient as then given some time to void on their own.  Patient can void  43ml on their own after some time.  Patient tolerated well.  Performed by: Estill Bamberg RN  Follow up/ Additional notes:  prior to fill pull flow - catheter was irrigated as foley bag had blood noted in bag. Irrigated with 1000cc of sterile water removing clots and urine turning pink tinged. Okay to proceed with fill pull flow as per Dr. Felipa Eth

## 2021-10-13 NOTE — Progress Notes (Addendum)
Assessment: 1. 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 Postprocedural urinary retention   3. Gross hematuria - post procedure   4. BPH with obstruction/lower urinary tract symptoms     Plan: Foley catheter removed today  He was unable to void a significant amount in the office.  I discussed the options of replacing the Foley catheter versus continued attempts at spontaneous voiding.  He would like to try to void on his own. Patient advised to return to the office later today if he is unable to void or void any significant amount.   Complete antibiotics. Return to office in approximately 2 weeks with bladder scan if he is voiding.  Addendum: Patient returned to the office this afternoon unable to void.  He reported voiding approximately 60 mL of bloody urine with small clots about 1 hour after he left the office this morning.  Since that time he has been unable to void further.  He is complaining of lower abdominal discomfort. Bladder scan showed a volume of 764 ml. A 20 French Foley catheter was placed with immediate return of 750 mL of dark bloody urine.  The catheter was irrigated with 1 L of sterile water with return of multiple small clots.  Irrigation was continued into the urine was light pink in color.  No further clots were irrigated.  Will continue foley for an additional 3-4 days. Continue antibiotics - Cipro 500 mg daily provided Return to office in 3 day for re-evaluation, possible voiding trial  Chief Complaint:  Chief Complaint  Patient presents with   Urethral Stricture    History of Present Illness:  Louis Campos is a 71 y.o. year old male who is seen for further evaluation of lower urinary tract symptoms.  He has a history of UTIs approximately once a year for several years beginning in 2014.  These resolved spontaneously.  About 1 year ago,  he noted foul-smelling and cloudy urine.  He was tested for UTI and by report his culture  was negative.  He has continued to have foul-smelling and cloudy urine.  He also reports urinary urgency at times, slight discomfort with voiding, weak stream, and nocturia 1-2 times. No gross hematuria or flank pain. IPSS = 13.  PVR = 350 mL. CT renal stone study from 07/11/2021 showed a small nonobstructing right renal stone, small calculus in the bladder, no evidence of renal mass or obstruction. Urine culture from 2/23 grew 25-50K staph haemolyticus. Treated with macrobid.  At his return visit in March 2023, he continued on alfuzosin.  He noted an improvement in the color and odor of his urine.  He continued to have a decreased stream, frequency, and sensation of incomplete emptying.  No dysuria or gross hematuria. IPSS = 15.  PVR = 330 ml. He was changed to silodosin 8 mg daily.  He continued on silodosin without significant change in his urinary symptoms.  He continued to have a weak stream, sensation of incomplete emptying, frequency and urgency. IPSS = 17. Cystoscopy demonstrated a narrow bulbar urethral stricture which would not allow passage of the cystoscope.  He underwent balloon dilation of the urethral stricture on 10/08/2021.  A OptiLume balloon was used.  He was discharged with a catheter in place. He presents today for a voiding trial. His catheter has been draining well.  He did notice gross hematuria beginning yesterday.  He has not seen any clots.  He continues on his antibiotics.   Portions of  the above documentation were copied from a prior visit for review purposes only.  Past Medical History:  Past Medical History:  Diagnosis Date   Acid reflux    patient denies    Past Surgical History:  Past Surgical History:  Procedure Laterality Date   BIOPSY  09/09/2021   Procedure: BIOPSY;  Surgeon: Eloise Harman, DO;  [facility]: [facility];  Service: Endoscopy;;   CYSTOSCOPY WITH URETHRAL DILATATION N/A 10/08/2021   Procedure: CYSTOSCOPY WITH URETHRAL DILATATION-  balloon dilatation with OPTILUME;  Surgeon: Primus Bravo., MD;  Location: [facility];  Service: Urology;  Laterality: N/A;   ESOPHAGEAL BRUSHING  09/09/2021   Procedure: ESOPHAGEAL BRUSHING;  Surgeon: Eloise Harman, DO;  [facility];  Service: Endoscopy;;   ESOPHAGOGASTRODUODENOSCOPY (EGD) WITH PROPOFOL N/A 09/09/2021   Procedure: ESOPHAGOGASTRODUODENOSCOPY (EGD) WITH PROPOFOL;  Surgeon: Eloise Harman, DO;  [facility];  Service: Endoscopy;  Laterality: N/A;  2:15pm   HAND SURGERY     right    Allergies:  No Known Allergies  Family History:  Family History  Problem Relation Age of Onset   Colon cancer Neg Hx    Esophageal cancer Neg Hx     Social History:  Social History   Tobacco Use   Smoking status: Former    Packs/day: 1.50    Years: 20.00    Pack years: 30.00    Types: Cigarettes    Quit date: 2000    Years since quitting: 23.4   Smokeless tobacco: Never  Vaping Use   Vaping Use: Never used  Substance Use Topics   Alcohol use: Yes    Comment: couple of shots every week.   Drug use: Not Currently    ROS: Constitutional:  Negative for fever, chills, weight loss CV: Negative for chest pain, previous MI, hypertension Respiratory:  Negative for shortness of breath, wheezing, sleep apnea, frequent cough GI:  Negative for nausea, vomiting, bloody stool, GERD  Physical exam: BP 122/81   Pulse (!) 108  GENERAL APPEARANCE:  Well appearing, well developed, well nourished, NAD HEENT:  Atraumatic, normocephalic, oropharynx clear NECK:  Supple without lymphadenopathy or thyromegaly ABDOMEN:  Soft, non-tender, no masses EXTREMITIES:  Moves all extremities well, without clubbing, cyanosis, or edema NEUROLOGIC:  Alert and oriented x 3, normal gait, CN II-XII grossly intact MENTAL STATUS:  appropriate BACK:  Non-tender to palpation, No CVAT SKIN:  Warm, dry, and intact   Results: None   Procedure:  VOIDING TRIAL  A voiding trial  was performed in the office today.   Volume of sterile water instilled: 300 mL Foley catheter removed intact. Volume voided by patient: 50 mL  Instructed to return to office if has not voided by 4 PM.

## 2021-10-16 ENCOUNTER — Encounter: Payer: Self-pay | Admitting: Urology

## 2021-10-16 ENCOUNTER — Ambulatory Visit: Payer: Medicare HMO | Admitting: Urology

## 2021-10-16 VITALS — BP 127/84 | HR 83

## 2021-10-16 DIAGNOSIS — R338 Other retention of urine: Secondary | ICD-10-CM | POA: Diagnosis not present

## 2021-10-16 DIAGNOSIS — R310 Gross hematuria: Secondary | ICD-10-CM | POA: Diagnosis not present

## 2021-10-16 DIAGNOSIS — N9989 Other postprocedural complications and disorders of genitourinary system: Secondary | ICD-10-CM

## 2021-10-16 DIAGNOSIS — N35912 Unspecified bulbous urethral stricture, male: Secondary | ICD-10-CM | POA: Diagnosis not present

## 2021-10-16 NOTE — Progress Notes (Signed)
Assessment: 1. 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 Postprocedural urinary retention   3. Gross hematuria - post procedure     Plan: Foley catheter removed today  Continue silodosin Patient advised to return to the office later today if he is unable to void or void any significant amount.   Return to office in 1 week with bladder scan   Chief Complaint:  Chief Complaint  Patient presents with   Urinary Retention    History of Present Illness:  Louis Campos is a 71 y.o. year old male who is seen for further evaluation of lower urinary tract symptoms.  He has a history of UTIs approximately once a year for several years beginning in 2014.  These resolved spontaneously.  About 1 year ago,  he noted foul-smelling and cloudy urine.  He was tested for UTI and by report his culture was negative.  He has continued to have foul-smelling and cloudy urine.  He also reports urinary urgency at times, slight discomfort with voiding, weak stream, and nocturia 1-2 times. No gross hematuria or flank pain. IPSS = 13.  PVR = 350 mL. CT renal stone study from 07/11/2021 showed a small nonobstructing right renal stone, small calculus in the bladder, no evidence of renal mass or obstruction. Urine culture from 2/23 grew 25-50K staph haemolyticus. Treated with macrobid.  At his return visit in March 2023, he continued on alfuzosin.  He noted an improvement in the color and odor of his urine.  He continued to have a decreased stream, frequency, and sensation of incomplete emptying.  No dysuria or gross hematuria. IPSS = 15.  PVR = 330 ml. He was changed to silodosin 8 mg daily.  He continued on silodosin without significant change in his urinary symptoms.  He continued to have a weak stream, sensation of incomplete emptying, frequency and urgency. IPSS = 17. Cystoscopy demonstrated a narrow bulbar urethral stricture which would not allow passage of the cystoscope.  He  underwent balloon dilation of the urethral stricture on 10/08/2021.  A OptiLume balloon was used.  He was discharged with a catheter in place. He presented for a voiding trial on 10/13/2021.  He noted gross hematuria beginning 10/12/2021.[name]   His catheter was removed but he was unable to void and returned later that afternoon in retention.  A Foley catheter was placed with return of 750 mL of dark bloody urine.  The catheter was irrigated with return of multiple small clots.  He returns today for a repeat voiding trial.  His catheter has been draining well.  He has not had any gross hematuria.  He did have problems with drainage of the catheter this morning due to some small clots in the tubing.  He completed his Cipro this morning.  Portions of the above documentation were copied from a prior visit for review purposes only.  Past Medical History:  Past Medical History:  Diagnosis Date   Acid reflux    patient denies    Past Surgical History:  Past Surgical History:  Procedure Laterality Date   BIOPSY  09/09/2021   Procedure: BIOPSY;  Surgeon: Eloise Harman, DO;  [facility];  Service: Endoscopy;;   CYSTOSCOPY WITH URETHRAL DILATATION N/A 10/08/2021   Procedure: CYSTOSCOPY WITH URETHRAL DILATATION- balloon dilatation with OPTILUME;  Surgeon: Primus Bravo., MD;  Location: [facility];  Service: Urology;  Laterality: N/A;   ESOPHAGEAL BRUSHING  09/09/2021   Procedure: ESOPHAGEAL BRUSHING;  Surgeon: Eloise Harman, DO;  [facility];  Service: Endoscopy;;   ESOPHAGOGASTRODUODENOSCOPY (EGD) WITH PROPOFOL N/A 09/09/2021   Procedure: ESOPHAGOGASTRODUODENOSCOPY (EGD) WITH PROPOFOL;  Surgeon: Eloise Harman, DO;  [facility];  Service: Endoscopy;  Laterality: N/A;  2:15pm   HAND SURGERY     right    Allergies:  No Known Allergies  Family History:  Family History  Problem Relation Age of Onset   Colon cancer Neg Hx    Esophageal cancer Neg Hx      Social History:  Social History   Tobacco Use   Smoking status: Former    Packs/day: 1.50    Years: 20.00    Pack years: 30.00    Types: Cigarettes    Quit date: 2000    Years since quitting: 23.4   Smokeless tobacco: Never  Vaping Use   Vaping Use: Never used  Substance Use Topics   Alcohol use: Yes    Comment: couple of shots every week.   Drug use: Not Currently    ROS: Constitutional:  Negative for fever, chills, weight loss CV: Negative for chest pain, previous MI, hypertension Respiratory:  Negative for shortness of breath, wheezing, sleep apnea, frequent cough GI:  Negative for nausea, vomiting, bloody stool, GERD  Physical exam: BP 127/84   Pulse 83  GENERAL APPEARANCE:  Well appearing, well developed, well nourished, NAD HEENT:  Atraumatic, normocephalic, oropharynx clear NECK:  Supple without lymphadenopathy or thyromegaly ABDOMEN:  Soft, non-tender, no masses EXTREMITIES:  Moves all extremities well, without clubbing, cyanosis, or edema NEUROLOGIC:  Alert and oriented x 3, normal gait, CN II-XII grossly intact MENTAL STATUS:  appropriate BACK:  Non-tender to palpation, No CVAT SKIN:  Warm, dry, and intact   Results: None  The Foley catheter was irrigated with 500 mL of sterile water with return of multiple small clots.  Irrigation was continued until no further clots were obtained.  Procedure:  VOIDING TRIAL  A voiding trial was performed in the office today.   Volume of sterile water instilled: 240 ml Foley catheter removed intact. Volume voided by patient: 200 ml  Instructed to return to office if has not voided by 4 PM.

## 2021-10-16 NOTE — Progress Notes (Signed)
Fill and Pull Catheter Removal  Patient is present today for a catheter removal.  Patient was cleaned and prepped in a sterile fashion 227ml of sterile water/ saline was instilled into the bladder when the patient felt the urge to urinate. 45ml of water was then drained from the balloon.  A 20FR foley cath was removed from the bladder no complications were noted .  Patient as then given some time to void on their own.  Patient can void  222ml on their own after some time.  Patient tolerated well.  Performed by: Estill Bamberg RN  Follow up/ Additional notes: prior to fill pull flow, catheter was irrigated by MD to pull out clots. Tolerated procedure without difficulty.

## 2021-10-24 ENCOUNTER — Encounter: Payer: Self-pay | Admitting: Urology

## 2021-10-24 ENCOUNTER — Ambulatory Visit: Payer: Medicare HMO | Admitting: Urology

## 2021-10-24 VITALS — BP 135/83 | HR 85 | Ht 68.0 in | Wt 160.0 lb

## 2021-10-24 DIAGNOSIS — N138 Other obstructive and reflux uropathy: Secondary | ICD-10-CM

## 2021-10-24 DIAGNOSIS — N35912 Unspecified bulbous urethral stricture, male: Secondary | ICD-10-CM | POA: Diagnosis not present

## 2021-10-24 DIAGNOSIS — N401 Enlarged prostate with lower urinary tract symptoms: Secondary | ICD-10-CM

## 2021-10-24 LAB — URINALYSIS, ROUTINE W REFLEX MICROSCOPIC
Bilirubin, UA: NEGATIVE
Glucose, UA: NEGATIVE
Ketones, UA: NEGATIVE
Leukocytes,UA: NEGATIVE
Nitrite, UA: NEGATIVE
Protein,UA: NEGATIVE
Specific Gravity, UA: 1.02 (ref 1.005–1.030)
Urobilinogen, Ur: 0.2 mg/dL (ref 0.2–1.0)
pH, UA: 6 (ref 5.0–7.5)

## 2021-10-24 LAB — MICROSCOPIC EXAMINATION
Bacteria, UA: NONE SEEN
Epithelial Cells (non renal): NONE SEEN /hpf (ref 0–10)
RBC, Urine: NONE SEEN /hpf (ref 0–2)
Renal Epithel, UA: NONE SEEN /hpf
WBC, UA: NONE SEEN /hpf (ref 0–5)

## 2021-10-24 LAB — BLADDER SCAN AMB NON-IMAGING: Scan Result: 279

## 2021-10-24 NOTE — Progress Notes (Signed)
Assessment: 1. 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 BPH with obstruction/lower urinary tract symptoms     Plan: Continue silodosin Return to office in 6 weeks.  Chief Complaint:  Chief Complaint  Patient presents with   Urethral Stricture    History of Present Illness:  Louis Campos is a 71 y.o. year old male who is seen for further evaluation of lower urinary tract symptoms.  He has a history of UTIs approximately once a year for several years beginning in 2014.  These resolved spontaneously.  About 1 year ago,  he noted foul-smelling and cloudy urine.  He was tested for UTI and by report his culture was negative.  He has continued to have foul-smelling and cloudy urine.  He also reports urinary urgency at times, slight discomfort with voiding, weak stream, and nocturia 1-2 times. No gross hematuria or flank pain. IPSS = 13.  PVR = 350 mL. CT renal stone study from 07/11/2021 showed a small nonobstructing right renal stone, small calculus in the bladder, no evidence of renal mass or obstruction. Urine culture from 2/23 grew 25-50K staph haemolyticus. Treated with macrobid.  At his return visit in March 2023, he continued on alfuzosin.  He noted an improvement in the color and odor of his urine.  He continued to have a decreased stream, frequency, and sensation of incomplete emptying.  No dysuria or gross hematuria. IPSS = 15.  PVR = 330 ml. He was changed to silodosin 8 mg daily.  He continued on silodosin without significant change in his urinary symptoms.  He continued to have a weak stream, sensation of incomplete emptying, frequency and urgency. IPSS = 17. Cystoscopy demonstrated a narrow bulbar urethral stricture which would not allow passage of the cystoscope.  He underwent balloon dilation of the urethral stricture on 10/08/2021.  A OptiLume balloon was used.  He was discharged with a catheter in place. He presented for a voiding trial on  10/13/2021.  He noted gross hematuria beginning 10/12/2021.[name]   His catheter was removed but he was unable to void and returned later that afternoon in retention.  A Foley catheter was placed with return of 750 mL of dark bloody urine.  The catheter was irrigated with return of multiple small clots. His catheter was removed on 10/16/2021 after a successful voiding trial.  He returns today for follow-up.  He has been voiding spontaneously since the Foley was removed.  He has not had any gross hematuria.  No dysuria.  He continues with some frequency and urgency.  He has not seen a significant change in his stream.   Portions of the above documentation were copied from a prior visit for review purposes only.  Past Medical History:  Past Medical History:  Diagnosis Date   Acid reflux    patient denies    Past Surgical History:  Past Surgical History:  Procedure Laterality Date   BIOPSY  09/09/2021   Procedure: BIOPSY;  Surgeon: Lanelle Bal, DO;  [facility];  Service: Endoscopy;;   CYSTOSCOPY WITH URETHRAL DILATATION N/A 10/08/2021   Procedure: CYSTOSCOPY WITH URETHRAL DILATATION- balloon dilatation with OPTILUME;  Surgeon: Milderd Meager., MD;  Location: [facility];  Service: Urology;  Laterality: N/A;   ESOPHAGEAL BRUSHING  09/09/2021   Procedure: ESOPHAGEAL BRUSHING;  Surgeon: Lanelle Bal, DO;  [facility];  Service: Endoscopy;;   ESOPHAGOGASTRODUODENOSCOPY (EGD) WITH PROPOFOL N/A 09/09/2021   Procedure: ESOPHAGOGASTRODUODENOSCOPY (EGD) WITH PROPOFOL;  Surgeon: Lanelle Bal, DO;  [facility];  Service: Endoscopy;  Laterality: N/A;  2:15pm   HAND SURGERY     right    Allergies:  No Known Allergies  Family History:  Family History  Problem Relation Age of Onset   Colon cancer Neg Hx    Esophageal cancer Neg Hx     Social History:  Social History   Tobacco Use   Smoking status: Former    Packs/day: 1.50    Years: 20.00     Pack years: 30.00    Types: Cigarettes    Quit date: 2000    Years since quitting: 23.4   Smokeless tobacco: Never  Vaping Use   Vaping Use: Never used  Substance Use Topics   Alcohol use: Yes    Comment: couple of shots every week.   Drug use: Not Currently    ROS: Constitutional:  Negative for fever, chills, weight loss CV: Negative for chest pain, previous MI, hypertension Respiratory:  Negative for shortness of breath, wheezing, sleep apnea, frequent cough GI:  Negative for nausea, vomiting, bloody stool, GERD  Physical exam: BP 135/83   Pulse 85   Ht 5\' 8"  (1.727 m)   Wt 160 lb (72.6 kg)   BMI 24.33 kg/m  GENERAL APPEARANCE:  Well appearing, well developed, well nourished, NAD HEENT:  Atraumatic, normocephalic, oropharynx clear NECK:  Supple without lymphadenopathy or thyromegaly ABDOMEN:  Soft, non-tender, no masses EXTREMITIES:  Moves all extremities well, without clubbing, cyanosis, or edema NEUROLOGIC:  Alert and oriented x 3, normal gait, CN II-XII grossly intact MENTAL STATUS:  appropriate BACK:  Non-tender to palpation, No CVAT SKIN:  Warm, dry, and intact   Results: U/A: dipstick negative  PVR:  279 ml

## 2021-10-24 NOTE — Progress Notes (Signed)
post void residual =271mL

## 2021-12-04 ENCOUNTER — Ambulatory Visit: Payer: Medicare HMO | Admitting: Urology

## 2021-12-04 ENCOUNTER — Encounter: Payer: Self-pay | Admitting: Urology

## 2021-12-04 VITALS — BP 140/88 | HR 76 | Ht 67.5 in | Wt 160.0 lb

## 2021-12-04 DIAGNOSIS — N35912 Unspecified bulbous urethral stricture, male: Secondary | ICD-10-CM | POA: Diagnosis not present

## 2021-12-04 DIAGNOSIS — N401 Enlarged prostate with lower urinary tract symptoms: Secondary | ICD-10-CM

## 2021-12-04 DIAGNOSIS — N138 Other obstructive and reflux uropathy: Secondary | ICD-10-CM | POA: Diagnosis not present

## 2021-12-04 LAB — BLADDER SCAN AMB NON-IMAGING: Scan Result: 267

## 2021-12-04 LAB — URINALYSIS, ROUTINE W REFLEX MICROSCOPIC
Bilirubin, UA: NEGATIVE
Ketones, UA: NEGATIVE
Leukocytes,UA: NEGATIVE
Nitrite, UA: NEGATIVE
Protein,UA: NEGATIVE
RBC, UA: NEGATIVE
Specific Gravity, UA: 1.025 (ref 1.005–1.030)
Urobilinogen, Ur: 0.2 mg/dL (ref 0.2–1.0)
pH, UA: 5 (ref 5.0–7.5)

## 2021-12-04 NOTE — Progress Notes (Signed)
Assessment: 1. 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 BPH with obstruction/lower urinary tract symptoms     Plan: Continue silodosin - try taking qHS to see if this improves his symptoms Return to office in 2 months PSA next visit  Chief Complaint:  Chief Complaint  Patient presents with   Urethral Stricture    History of Present Illness:  Louis Campos is a 71 y.o. year old male who is seen for further evaluation of lower urinary tract symptoms.  He has a history of UTIs approximately once a year for several years beginning in 2014.  These resolved spontaneously.  About 1 year ago,  he noted foul-smelling and cloudy urine.  He was tested for UTI and by report his culture was negative.  He continued to have foul-smelling and cloudy urine.  He also reported urinary urgency at times, slight discomfort with voiding, weak stream, and nocturia 1-2 times. No gross hematuria or flank pain. IPSS = 13.  PVR = 350 mL. CT renal stone study from 07/11/2021 showed a small nonobstructing right renal stone, small calculus in the bladder, no evidence of renal mass or obstruction. Urine culture from 2/23 grew 25-50K staph haemolyticus. Treated with macrobid.  At his return visit in March 2023, he continued on alfuzosin.  He noted an improvement in the color and odor of his urine.  He continued to have a decreased stream, frequency, and sensation of incomplete emptying.  No dysuria or gross hematuria. IPSS = 15.  PVR = 330 ml. He was changed to silodosin 8 mg daily.  He continued on silodosin without significant change in his urinary symptoms.  He continued to have a weak stream, sensation of incomplete emptying, frequency and urgency. IPSS = 17. Cystoscopy demonstrated a narrow bulbar urethral stricture which would not allow passage of the cystoscope.  He underwent balloon dilation of the urethral stricture on 10/08/2021.  A OptiLume balloon was used.  He was  discharged with a catheter in place. He presented for a voiding trial on 10/13/2021.  He noted gross hematuria beginning 10/12/2021.[name]   His catheter was removed but he was unable to void and returned later that afternoon in retention.  A Foley catheter was placed with return of 750 mL of dark bloody urine.  The catheter was irrigated with return of multiple small clots. His catheter was removed on 10/16/2021 after a successful voiding trial.  At his visit in 6/23, he was voiding spontaneously after foley removal. No gross hematuria.  No dysuria.  He continued with some frequency and urgency.  No significant change in his stream. PVR = 279 ml.  He returns today for follow-up.  He continues to report frequency, decreased force of stream, and sensation of incomplete emptying.  His symptoms are primarily in the morning.  He has nocturia 0-1 time per night.  No dysuria or gross hematuria.  He continues on silodosin. IPSS = 14 today.  Portions of the above documentation were copied from a prior visit for review purposes only.  Past Medical History:  Past Medical History:  Diagnosis Date   Acid reflux    patient denies    Past Surgical History:  Past Surgical History:  Procedure Laterality Date   BIOPSY  09/09/2021   Procedure: BIOPSY;  Surgeon: Lanelle Bal, DO;  [facility];  Service: Endoscopy;;   CYSTOSCOPY WITH URETHRAL DILATATION N/A 10/08/2021   Procedure: CYSTOSCOPY WITH URETHRAL DILATATION- balloon dilatation with OPTILUME;  Surgeon: Milderd Meager., MD;  Location: [facility];  Service: Urology;  Laterality: N/A;   ESOPHAGEAL BRUSHING  09/09/2021   Procedure: ESOPHAGEAL BRUSHING;  Surgeon: Lanelle Bal, DO;  [facility];  Service: Endoscopy;;   ESOPHAGOGASTRODUODENOSCOPY (EGD) WITH PROPOFOL N/A 09/09/2021   Procedure: ESOPHAGOGASTRODUODENOSCOPY (EGD) WITH PROPOFOL;  Surgeon: Lanelle Bal, DO;  [facility];  Service: Endoscopy;  Laterality:  N/A;  2:15pm   HAND SURGERY     right    Allergies:  No Known Allergies  Family History:  Family History  Problem Relation Age of Onset   Colon cancer Neg Hx    Esophageal cancer Neg Hx     Social History:  Social History   Tobacco Use   Smoking status: Former    Packs/day: 1.50    Years: 20.00    Total pack years: 30.00    Types: Cigarettes    Quit date: 2000    Years since quitting: 23.5   Smokeless tobacco: Never  Vaping Use   Vaping Use: Never used  Substance Use Topics   Alcohol use: Yes    Comment: couple of shots every week.   Drug use: Not Currently    ROS: Constitutional:  Negative for fever, chills, weight loss CV: Negative for chest pain, previous MI, hypertension Respiratory:  Negative for shortness of breath, wheezing, sleep apnea, frequent cough GI:  Negative for nausea, vomiting, bloody stool, GERD  Physical exam: BP 140/88   Pulse 76   Ht 5' 7.5" (1.715 m)   Wt 160 lb (72.6 kg)   BMI 24.69 kg/m  GENERAL APPEARANCE:  Well appearing, well developed, well nourished, NAD HEENT:  Atraumatic, normocephalic, oropharynx clear NECK:  Supple without lymphadenopathy or thyromegaly ABDOMEN:  Soft, non-tender, no masses EXTREMITIES:  Moves all extremities well, without clubbing, cyanosis, or edema NEUROLOGIC:  Alert and oriented x 3, normal gait, CN II-XII grossly intact MENTAL STATUS:  appropriate BACK:  Non-tender to palpation, No CVAT SKIN:  Warm, dry, and intact  Results: U/A: Trace glucose   PVR: 267 ml

## 2021-12-04 NOTE — Progress Notes (Signed)
post void residual =267ml 

## 2021-12-11 DIAGNOSIS — Z0100 Encounter for examination of eyes and vision without abnormal findings: Secondary | ICD-10-CM | POA: Diagnosis not present

## 2021-12-11 DIAGNOSIS — H5200 Hypermetropia, unspecified eye: Secondary | ICD-10-CM | POA: Diagnosis not present

## 2022-01-07 ENCOUNTER — Ambulatory Visit: Payer: Medicare HMO | Admitting: Gastroenterology

## 2022-01-28 ENCOUNTER — Encounter: Payer: Self-pay | Admitting: Internal Medicine

## 2022-01-28 ENCOUNTER — Ambulatory Visit (INDEPENDENT_AMBULATORY_CARE_PROVIDER_SITE_OTHER): Payer: Medicare HMO | Admitting: Internal Medicine

## 2022-01-28 VITALS — BP 155/78 | HR 66 | Temp 98.2°F | Ht 67.5 in | Wt 165.5 lb

## 2022-01-28 DIAGNOSIS — K2270 Barrett's esophagus without dysplasia: Secondary | ICD-10-CM

## 2022-01-28 DIAGNOSIS — R1312 Dysphagia, oropharyngeal phase: Secondary | ICD-10-CM | POA: Diagnosis not present

## 2022-01-28 DIAGNOSIS — Z1211 Encounter for screening for malignant neoplasm of colon: Secondary | ICD-10-CM

## 2022-01-28 DIAGNOSIS — R1310 Dysphagia, unspecified: Secondary | ICD-10-CM

## 2022-01-28 NOTE — Progress Notes (Signed)
Referring Provider: Juliette Alcide, MD Primary Care Physician:  Juliette Alcide, MD Primary GI:  Dr. Marletta Lor  Chief Complaint  Patient presents with   post procedure follow up    Had EGD on 09/09/21. Gets choked on pills and sometimes food.     HPI:   Louis Campos is a 71 y.o. male who presents   Past Medical History:  Diagnosis Date   Acid reflux    patient denies    Past Surgical History:  Procedure Laterality Date   BIOPSY  09/09/2021   Procedure: BIOPSY;  Surgeon: Lanelle Bal, DO;  [facility];  Service: Endoscopy;;   CYSTOSCOPY WITH URETHRAL DILATATION N/A 10/08/2021   Procedure: CYSTOSCOPY WITH URETHRAL DILATATION- balloon dilatation with OPTILUME;  Surgeon: Milderd Meager., MD;  Location: [facility];  Service: Urology;  Laterality: N/A;   ESOPHAGEAL BRUSHING  09/09/2021   Procedure: ESOPHAGEAL BRUSHING;  Surgeon: Lanelle Bal, DO;  [facility];  Service: Endoscopy;;   ESOPHAGOGASTRODUODENOSCOPY (EGD) WITH PROPOFOL N/A 09/09/2021   Procedure: ESOPHAGOGASTRODUODENOSCOPY (EGD) WITH PROPOFOL;  Surgeon: Lanelle Bal, DO;  [facility];  Service: Endoscopy;  Laterality: N/A;  2:15pm   HAND SURGERY     right    Current Outpatient Medications  Medication Sig Dispense Refill   ibuprofen (ADVIL) 200 MG tablet Take 200-600 mg by mouth every 6 (six) hours as needed for headache or mild pain.     pantoprazole (PROTONIX) 40 MG tablet Take 1 tablet (40 mg total) by mouth 2 (two) times daily. 60 tablet 11   silodosin (RAPAFLO) 8 MG CAPS capsule Take 1 capsule (8 mg total) by mouth daily with breakfast. 30 capsule 11   No current facility-administered medications for this visit.    Allergies as of 01/28/2022   (No Known Allergies)    Family History  Problem Relation Age of Onset   Colon cancer Neg Hx    Esophageal cancer Neg Hx     Social History   Socioeconomic History   Marital status: Married    Spouse name:  Not on file   Number of children: Not on file   Years of education: Not on file   Highest education level: Not on file  Occupational History   Not on file  Tobacco Use   Smoking status: Former    Packs/day: 1.50    Years: 20.00    Total pack years: 30.00    Types: Cigarettes    Quit date: 2000    Years since quitting: 23.6    Passive exposure: Past   Smokeless tobacco: Never  Vaping Use   Vaping Use: Never used  Substance and Sexual Activity   Alcohol use: Yes    Comment: couple of shots every week.   Drug use: Not Currently   Sexual activity: Yes  Other Topics Concern   Not on file  Social History Narrative   Not on file   Social Determinants of Health   Financial Resource Strain: Not on file  Food Insecurity: Not on file  Transportation Needs: Not on file  Physical Activity: Not on file  Stress: Not on file  Social Connections: Not on file    Subjective: Review of Systems  Constitutional:  Negative for chills and fever.  HENT:  Negative for congestion and hearing loss.   Eyes:  Negative for blurred vision and double vision.  Respiratory:  Negative for cough and shortness of breath.   Cardiovascular:  Negative for chest pain and palpitations.  Gastrointestinal:  Negative for abdominal pain, blood in stool, constipation, diarrhea, heartburn, melena and vomiting.       Dysphagia  Genitourinary:  Negative for dysuria and urgency.  Musculoskeletal:  Negative for joint pain and myalgias.  Skin:  Negative for itching and rash.  Neurological:  Negative for dizziness and headaches.  Psychiatric/Behavioral:  Negative for depression. The patient is not nervous/anxious.      Objective: BP (!) 155/78 (BP Location: Left Arm, Patient Position: Sitting, Cuff Size: Normal)   Pulse 66   Temp 98.2 F (36.8 C) (Oral)   Ht 5' 7.5" (1.715 m)   Wt 165 lb 8 oz (75.1 kg)   BMI 25.54 kg/m  Physical Exam Constitutional:      Appearance: Normal appearance.  HENT:     Head:  Normocephalic and atraumatic.  Eyes:     Extraocular Movements: Extraocular movements intact.     Conjunctiva/sclera: Conjunctivae normal.  Cardiovascular:     Rate and Rhythm: Normal rate and regular rhythm.  Pulmonary:     Effort: Pulmonary effort is normal.     Breath sounds: Normal breath sounds.  Abdominal:     General: Bowel sounds are normal.     Palpations: Abdomen is soft.  Musculoskeletal:        General: Normal range of motion.     Cervical back: Normal range of motion and neck supple.  Skin:    General: Skin is warm.  Neurological:     General: No focal deficit present.     Mental Status: He is alert and oriented to person, place, and time.  Psychiatric:        Mood and Affect: Mood normal.        Behavior: Behavior normal.      Assessment: *Oropharyngeal dysphagia-not improved *Candidal esophagitis *Barrett's esophagus-short segment *Chronic GERD-well-controlled *Colon cancer screening  Plan: Discussed patient's biopsies in depth with him today.  Esophageal biopsy showed evidence of Barrett's esophagus.  Continue on pantoprazole, okay to decrease to once daily.  Repeat EGD for surveillance in 5 years.  Dysphagia not improved status post course of 14 days of Diflucan.  For his oral pharyngeal dysphagia, will order modified barium swallow study with speech therapy.  Appreciate their help with this patient.  Patient requesting referral to ENT for further evaluation.  We will send today.  No prior colonoscopy. Not interested in a colonoscopy or Cologuard.  Denies constipation, diarrhea, BRBPR, melena.  Denies abdominal pain.  Follow-up in 6 months.  01/28/2022 10:37 AM   Disclaimer: This note was dictated with voice recognition software. Similar sounding words can inadvertently be transcribed and may not be corrected upon review.

## 2022-01-28 NOTE — Patient Instructions (Signed)
I am going to order a swallowing study today to further evaluate your difficulty swallowing as well as choking on pills.  We will refer you to ENT for further evaluation.  Continue on pantoprazole.  You can decrease to once daily.  Follow-up with GI in 6 months.  It was good seeing you again today.  Dr. Marletta Lor

## 2022-01-29 ENCOUNTER — Other Ambulatory Visit: Payer: Self-pay | Admitting: *Deleted

## 2022-01-29 DIAGNOSIS — R1312 Dysphagia, oropharyngeal phase: Secondary | ICD-10-CM

## 2022-02-04 ENCOUNTER — Other Ambulatory Visit (HOSPITAL_COMMUNITY): Payer: Self-pay | Admitting: Specialist

## 2022-02-04 DIAGNOSIS — R1312 Dysphagia, oropharyngeal phase: Secondary | ICD-10-CM

## 2022-02-04 DIAGNOSIS — K2270 Barrett's esophagus without dysplasia: Secondary | ICD-10-CM

## 2022-02-10 ENCOUNTER — Ambulatory Visit (HOSPITAL_COMMUNITY)
Admission: RE | Admit: 2022-02-10 | Discharge: 2022-02-10 | Disposition: A | Discharge status: Home / Self Care | Payer: Medicare HMO | Source: Ambulatory Visit | Attending: Internal Medicine | Admitting: Internal Medicine

## 2022-02-10 ENCOUNTER — Ambulatory Visit (HOSPITAL_COMMUNITY): Payer: Medicare HMO | Attending: Internal Medicine | Admitting: Speech Pathology

## 2022-02-10 ENCOUNTER — Encounter (HOSPITAL_COMMUNITY): Payer: Self-pay | Admitting: Speech Pathology

## 2022-02-10 DIAGNOSIS — R6339 Other feeding difficulties: Secondary | ICD-10-CM | POA: Diagnosis not present

## 2022-02-10 DIAGNOSIS — R1310 Dysphagia, unspecified: Secondary | ICD-10-CM | POA: Diagnosis not present

## 2022-02-10 DIAGNOSIS — R1312 Dysphagia, oropharyngeal phase: Secondary | ICD-10-CM | POA: Diagnosis not present

## 2022-02-10 DIAGNOSIS — K2270 Barrett's esophagus without dysplasia: Secondary | ICD-10-CM | POA: Insufficient documentation

## 2022-02-10 NOTE — Therapy (Signed)
[facility] [address] [place], [place], [address] Phone: [phone]   Fax:  [phone]  Modified Barium Swallow  Patient Details  Name: Louis Campos MRN: [id number] Date of Birth: 24-Oct-1950 No data recorded  Encounter Date: 02/10/2022   End of Session - 02/10/22 1816     Visit Number 1    Number of Visits 1    Authorization Type [employer]    SLP Start Time 1330    SLP Stop Time  1423    SLP Time Calculation (min) 53 min    Activity Tolerance Patient tolerated treatment well             Past Medical History:  Diagnosis Date   Acid reflux    patient denies    Past Surgical History:  Procedure Laterality Date   BIOPSY  09/09/2021   Procedure: BIOPSY;  Surgeon: Lanelle Bal, DO;  [facility];  Service: Endoscopy;;   CYSTOSCOPY WITH URETHRAL DILATATION N/A 10/08/2021   Procedure: CYSTOSCOPY WITH URETHRAL DILATATION- balloon dilatation with OPTILUME;  Surgeon: Milderd Meager., MD;  Location: [facility];  Service: Urology;  Laterality: N/A;   ESOPHAGEAL BRUSHING  09/09/2021   Procedure: ESOPHAGEAL BRUSHING;  Surgeon: Lanelle Bal, DO;  [facility];  Service: Endoscopy;;   ESOPHAGOGASTRODUODENOSCOPY (EGD) WITH PROPOFOL N/A 09/09/2021   Procedure: ESOPHAGOGASTRODUODENOSCOPY (EGD) WITH PROPOFOL;  Surgeon: Lanelle Bal, DO;  [facility];  Service: Endoscopy;  Laterality: N/A;  2:15pm   HAND SURGERY     right    There were no vitals filed for this visit.        General - 02/10/22 1800       General Information   Date of Onset 01/29/22    HPI Louis Campos is a 71 yo male who was referred for MBSS by Dr. Earnest Bailey due to pill dysphagia. Pt had EGD in April which showed short segment Barrett's esophagus and candidae. He continues to report globus sensation in his right neck when swallowing pills. He states that he occasionally coughs pills back up and has to  swallow them again. He denies difficulty swallowing liquids, solids, or his saliva. He indicates that he noticed this over a year ago, but seems to be worse. He has not been evaluated by ENT, but has been referred to see one.    Type of Study MBS-Modified Barium Swallow Study    Diet Prior to this Study Regular;Thin liquids    Temperature Spikes Noted No    Respiratory Status Room air    History of Recent Intubation No    Behavior/Cognition Alert;Cooperative;Pleasant mood    Oral Cavity Assessment Within Functional Limits    Oral Care Completed by SLP No    Oral Cavity - Dentition Adequate natural dentition    Vision Functional for self feeding    Self-Feeding Abilities Able to feed self    Patient Positioning Upright in chair    Baseline Vocal Quality Normal    Volitional Cough Strong    Volitional Swallow Able to elicit    Anatomy Within functional limits    Pharyngeal Secretions Not observed secondary MBS                Oral Preparation/Oral Phase - 02/10/22 1804       Oral Preparation/Oral Phase   Oral Phase Impaired      Oral - Solids   Oral - Regular Other (Comment)  rapid swallow on first bite with impaired timing of bolus     Electrical stimulation - Oral Phase   Was Electrical Stimulation Used No              Pharyngeal Phase - 02/10/22 1806       Pharyngeal Phase   Pharyngeal Phase Impaired      Pharyngeal - Thin   Pharyngeal- Thin Teaspoon Reduced tongue base retraction;Reduced epiglottic inversion;Pharyngeal residue - valleculae;Pharyngeal residue - pyriform    Pharyngeal- Thin Cup Reduced epiglottic inversion;Reduced tongue base retraction;Pharyngeal residue - valleculae;Pharyngeal residue - pyriform    Pharyngeal- Thin Straw Reduced epiglottic inversion;Reduced tongue base retraction;Pharyngeal residue - valleculae;Pharyngeal residue - pyriform;Penetration/Aspiration during swallow;Penetration/Apiration after swallow    Pharyngeal Material enters  airway, remains ABOVE vocal cords then ejected out      Pharyngeal - Solids   Pharyngeal- Puree Within functional limits    Pharyngeal- Regular Reduced tongue base retraction;Pharyngeal residue - valleculae    Pharyngeal- Pill Pharyngeal residue - valleculae;Pharyngeal residue - pyriform;Penetration/Apiration after swallow   Pt swallowed the barium tablet without incident, but then exhibited trace, silent aspiration of thins after the swallow from residuals in the pyriforms spilling down posterior tracheal wall   Pharyngeal Material enters airway, passes BELOW cords without attempt by patient to eject out (silent aspiration)      Pharyngeal Phase - Comment   Pharyngeal Comment Pt with reduced tongue base retraction and epiglottic deflection>> suspect lingual weakness      Electrical Stimulation - Pharyngeal Phase   Was Electrical Stimulation Used No              Cricopharyngeal Phase - 02/10/22 1812       Cervical Esophageal Phase   Cervical Esophageal Phase Within functional limits               Plan - 02/10/22 1817     Clinical Impression Statement Pt presents with mild oropharyngeal dysphagia characterized by impaired tongue base retraction and epiglottic deflection resulting in lingual residuals (posterior blade and base of tongue) and vallecular residue with liquids and regular textures. The liquid residuals when spill to pyriforms. Pt cued to swallow again (dry swallow) to clear, which was effective in removing residuals. On Pt's first bite of regular texture, he appeared to initiate swallow trigger when solid bolus was still in the center of oral cavity and more than 50% of the bolus then was retained in the valleculae (dry swallows cleared this). On the next bolus, he was encouraged to chew well and swallow hard, which resulted in timely swallow trigger and no residuals in pharynx. Pt typically has difficulty swallowing pills per his report, however swallow was WNL with  pill presented with cup thin, however some of the liquids pooled in the valleculae and pyriforms after the initial swallow and he exhibited trace, silent aspiration of thins between the arytenoids and down the posterior tracheal wall. Esophageal sweep was unremarkable. Pt cued to implement effortful or hard swallow which improved epiglottic deflection. He denies changes in speech, voice, or other domains. Recommend regular textures and thin liquids with implementation of effortful swallow and swallow 2x for each bite. Consider ENT consult to visually evaluate pharynx and larynx given Pt's concerns about his swallow worsening. No further SLP services indicated at this time.    Potential to Achieve Goals Good    Consulted and Agree with Plan of Care Patient             Patient will benefit  from skilled therapeutic intervention in order to improve the following deficits and impairments:   Oropharyngeal dysphagia     Recommendations/Treatment - 02/10/22 1813       Swallow Evaluation Recommendations   Recommended Consults Consider ENT evaluation    SLP Diet Recommendations Thin;Age appropriate regular    Liquid Administration via Cup    Medication Administration Whole meds with liquid   or whole with puree and follow with liquid wash   Supervision Patient able to self feed    Compensations Multiple dry swallows after each bite/sip;Effortful swallow    Postural Changes Seated upright at 90 degrees;Remain upright for at least 30 minutes after feeds/meals              Prognosis - 02/10/22 1816       Prognosis   Prognosis for Safe Diet Advancement Good      Individuals Consulted   Consulted and Agree with Results and Recommendations Patient    Report Sent to  Referring physician             Problem List Patient Active Problem List   Diagnosis Date Noted   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 [place], [place], [address] Phone: [phone]   Fax:  [phone]  Name: Qadir Folks MRN: [id number] Date of Birth: Nov 08, 1950

## 2022-02-12 ENCOUNTER — Ambulatory Visit: Payer: Medicare HMO | Admitting: Urology

## 2022-04-29 DIAGNOSIS — R1312 Dysphagia, oropharyngeal phase: Secondary | ICD-10-CM | POA: Diagnosis not present

## 2022-05-11 ENCOUNTER — Other Ambulatory Visit: Payer: Self-pay | Admitting: Urology

## 2022-05-13 DIAGNOSIS — R319 Hematuria, unspecified: Secondary | ICD-10-CM | POA: Diagnosis not present

## 2022-05-13 DIAGNOSIS — R300 Dysuria: Secondary | ICD-10-CM | POA: Diagnosis not present

## 2022-05-13 DIAGNOSIS — Z6826 Body mass index (BMI) 26.0-26.9, adult: Secondary | ICD-10-CM | POA: Diagnosis not present

## 2022-05-13 DIAGNOSIS — R030 Elevated blood-pressure reading, without diagnosis of hypertension: Secondary | ICD-10-CM | POA: Diagnosis not present

## 2022-05-28 ENCOUNTER — Ambulatory Visit: Payer: Medicare HMO | Admitting: Urology

## 2022-05-28 VITALS — BP 121/74 | HR 71

## 2022-05-28 DIAGNOSIS — N401 Enlarged prostate with lower urinary tract symptoms: Secondary | ICD-10-CM

## 2022-05-28 DIAGNOSIS — Z87440 Personal history of urinary (tract) infections: Secondary | ICD-10-CM | POA: Diagnosis not present

## 2022-05-28 DIAGNOSIS — N35912 Unspecified bulbous urethral stricture, male: Secondary | ICD-10-CM | POA: Diagnosis not present

## 2022-05-28 DIAGNOSIS — R339 Retention of urine, unspecified: Secondary | ICD-10-CM | POA: Diagnosis not present

## 2022-05-28 DIAGNOSIS — N138 Other obstructive and reflux uropathy: Secondary | ICD-10-CM | POA: Diagnosis not present

## 2022-05-28 NOTE — Progress Notes (Signed)
Subjective:  1. 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 History of UTI   3. BPH with obstruction/lower urinary tract symptoms   4. Incomplete bladder emptying     Louis Campos is a 72 yo male who has been followed by Dr. Felipa Eth.  He had a urethral stricture that was dilated with an optilume balloon in 5/23 without much improvement in his emptying.  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  He generally carries a PVR of about 345ml.   He had a UTI about 2 weeks ago.  He got antibiotic the PA in his PCP's office and did have culture that showed sensitivity.  His UA is clear today.   His PSA in 4/23 was 4.0.  His IPSS is 15 with nocturia x 1 and a sensation of incomplete emptying with a reduced stream and frequency.      IPSS     Row Name 05/28/22 1600         International Prostate Symptom Score   How often have you had the sensation of not emptying your bladder? More than half the time     How often have you had to urinate less than every two hours? More than half the time     How often have you found you stopped and started again several times when you urinated? Not at All     How often have you found it difficult to postpone urination? Less than 1 in 5 times     How often have you had a weak urinary stream? More than half the time     How often have you had to strain to start urination? Less than 1 in 5 times     How many times did you typically get up at night to urinate? 1 Time     Total IPSS Score 15       Quality of Life due to urinary symptoms   If you were to spend the rest of your life with your urinary condition just the way it is now how would you feel about that? Mixed                  ROS:  ROS:  A complete review of systems was performed.  All systems are negative except for pertinent findings as noted.   ROS  No Known Allergies  Outpatient Encounter Medications as of 05/28/2022  Medication Sig   ibuprofen (ADVIL) 200 MG  tablet Take 200-600 mg by mouth every 6 (six) hours as needed for headache or mild pain.   pantoprazole (PROTONIX) 40 MG tablet Take 1 tablet (40 mg total) by mouth 2 (two) times daily.   silodosin (RAPAFLO) 8 MG CAPS capsule Take 1 capsule (8 mg total) by mouth daily with breakfast.   No facility-administered encounter medications on file as of 05/28/2022.    Past Medical History:  Diagnosis Date   Acid reflux    patient denies    Past Surgical History:  Procedure Laterality Date   BIOPSY  09/09/2021   Procedure: BIOPSY;  Surgeon: Eloise Harman, DO;  [facility]: [facility];  Service: Endoscopy;;   CYSTOSCOPY WITH URETHRAL DILATATION N/A 10/08/2021   Procedure: CYSTOSCOPY WITH URETHRAL DILATATION- balloon dilatation with OPTILUME;  Surgeon: Primus Bravo., MD;  Location: [facility];  Service: Urology;  Laterality: N/A;   ESOPHAGEAL BRUSHING  09/09/2021   Procedure: ESOPHAGEAL BRUSHING;  Surgeon: Eloise Harman, DO;  Location:  [facility];  Service: Endoscopy;;   ESOPHAGOGASTRODUODENOSCOPY (EGD) WITH PROPOFOL N/A 09/09/2021   Procedure: ESOPHAGOGASTRODUODENOSCOPY (EGD) WITH PROPOFOL;  Surgeon: Lanelle Bal, DO;  Location: [facility];  Service: Endoscopy;  Laterality: N/A;  2:15pm   HAND SURGERY     right    Social History   Socioeconomic History   Marital status: Married    Spouse name: Not on file   Number of children: Not on file   Years of education: Not on file   Highest education level: Not on file  Occupational History   Not on file  Tobacco Use   Smoking status: Former    Packs/day: 1.50    Years: 20.00    Total pack years: 30.00    Types: Cigarettes    Quit date: 2000    Years since quitting: 24.0    Passive exposure: Past   Smokeless tobacco: Never  Vaping Use   Vaping Use: Never used  Substance and Sexual Activity   Alcohol use: Yes    Comment: couple of shots every week.   Drug use: Not Currently   Sexual activity: Yes  Other Topics  Concern   Not on file  Social History Narrative   Not on file   Social Determinants of Health   Financial Resource Strain: Not on file  Food Insecurity: Not on file  Transportation Needs: Not on file  Physical Activity: Not on file  Stress: Not on file  Social Connections: Not on file  Intimate Partner Violence: Not on file    Family History  Problem Relation Age of Onset   Colon cancer Neg Hx    Esophageal cancer Neg Hx        Objective: Vitals:   05/28/22 1532  BP: 121/74  Pulse: 71     Physical Exam  Lab Results:  PSA No results found for: "PSA" No results found for: "TESTOSTERONE"  UA is clear.   Studies/Results: No results found. No results found for this or any previous visit.  No results found for this or any previous visit.  No results found for this or any previous visit.  No results found for this or any previous visit.  No results found for this or any previous visit.  No valid procedures specified. No results found for this or any previous visit.  Results for orders placed during the hospital encounter of 07/11/21  CT RENAL STONE STUDY  Narrative CLINICAL DATA:  Lower abdominal pain with dysuria, initial encounter  EXAM: CT ABDOMEN AND PELVIS WITHOUT CONTRAST  TECHNIQUE: Multidetector CT imaging of the abdomen and pelvis was performed following the standard protocol without IV contrast.  RADIATION DOSE REDUCTION: This exam was performed according to the departmental dose-optimization program which includes automated exposure control, adjustment of the mA and/or kV according to patient size and/or use of iterative reconstruction technique.  COMPARISON:  02/20/2014 by report  FINDINGS: Lower chest: No acute abnormality.  Hepatobiliary: No focal liver abnormality is seen. No gallstones, gallbladder wall thickening, or biliary dilatation.  Pancreas: Unremarkable. No pancreatic ductal dilatation or surrounding inflammatory  changes.  Spleen: Normal in size without focal abnormality.  Adrenals/Urinary Tract: Adrenal glands are within normal limits. Kidneys are well visualized bilaterally. Tiny 1-2 mm nonobstructing right renal stone is noted in the midportion of the right kidney. The ureters are within normal limits. Bladder is well distended. A small dependent calcification is noted in the right half of the bladder likely representing recently passed stone.  Stomach/Bowel: Scattered diverticular change of the colon is noted without evidence of diverticulitis. No obstructive changes are seen. The appendix is within normal limits. Small bowel and stomach are unremarkable.  Vascular/Lymphatic: Aortic atherosclerosis. No enlarged abdominal or pelvic lymph nodes.  Reproductive: Prostate is unremarkable.  Other: No abdominal wall hernia or abnormality. No abdominopelvic ascites.  Musculoskeletal: No acute bony abnormality is noted. Degenerative changes of lumbar spine are seen.  IMPRESSION: Tiny nonobstructing right renal stone.  Small calculus in the bladder likely related to previously passed stone.  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   Electronically Signed By: Inez Catalina M.D. On: 07/11/2021 23:50    Assessment & Plan: UTI:  His UA has cleared following treatment of the UTI.  He doesn't want to continue getting those.  BPH with BOO and urethra stricture s/p dilation.   I am going to have him stay on the silodosin but return for a flowrate, PVR and cystoscopy to assess his urethra and outlet to see if he needs additional therapy.   His previous PSA was 4.  I am going to get a repeat prior to his next visit.     No orders of the defined types were placed in this encounter.    Orders Placed This Encounter  Procedures   Urinalysis, Routine w reflex microscopic   PSA, total and free    Prior to f/u    Standing Status:   Future    Standing Expiration Date:   09/26/2022       Return for Next available for flowrate, PVR and cystoscopy. .   CC: Burdine, Virgina Evener, MD      Irine Seal 05/29/2022

## 2022-05-29 ENCOUNTER — Encounter: Payer: Self-pay | Admitting: Urology

## 2022-05-29 LAB — URINALYSIS, ROUTINE W REFLEX MICROSCOPIC
Bilirubin, UA: NEGATIVE
Glucose, UA: NEGATIVE
Ketones, UA: NEGATIVE
Leukocytes,UA: NEGATIVE
Nitrite, UA: NEGATIVE
Protein,UA: NEGATIVE
RBC, UA: NEGATIVE
Specific Gravity, UA: 1.025 (ref 1.005–1.030)
Urobilinogen, Ur: 0.2 mg/dL (ref 0.2–1.0)
pH, UA: 5.5 (ref 5.0–7.5)

## 2022-06-17 DIAGNOSIS — Z0000 Encounter for general adult medical examination without abnormal findings: Secondary | ICD-10-CM | POA: Diagnosis not present

## 2022-06-25 ENCOUNTER — Encounter: Payer: Self-pay | Admitting: Internal Medicine

## 2022-07-16 ENCOUNTER — Ambulatory Visit: Payer: Medicare HMO | Admitting: Urology

## 2022-07-16 VITALS — BP 141/82 | HR 72 | Ht 67.5 in | Wt 166.0 lb

## 2022-07-16 DIAGNOSIS — N401 Enlarged prostate with lower urinary tract symptoms: Secondary | ICD-10-CM | POA: Diagnosis not present

## 2022-07-16 DIAGNOSIS — N3000 Acute cystitis without hematuria: Secondary | ICD-10-CM | POA: Diagnosis not present

## 2022-07-16 DIAGNOSIS — N35912 Unspecified bulbous urethral stricture, male: Secondary | ICD-10-CM

## 2022-07-16 DIAGNOSIS — R3912 Poor urinary stream: Secondary | ICD-10-CM | POA: Diagnosis not present

## 2022-07-16 DIAGNOSIS — R339 Retention of urine, unspecified: Secondary | ICD-10-CM | POA: Diagnosis not present

## 2022-07-16 DIAGNOSIS — N138 Other obstructive and reflux uropathy: Secondary | ICD-10-CM | POA: Diagnosis not present

## 2022-07-16 LAB — MICROSCOPIC EXAMINATION: WBC, UA: >30 /hpf — AB (ref 0–5)

## 2022-07-16 LAB — URINALYSIS, ROUTINE W REFLEX MICROSCOPIC
Bilirubin, UA: NEGATIVE
Glucose, UA: NEGATIVE
Ketones, UA: NEGATIVE
Nitrite, UA: NEGATIVE
Specific Gravity, UA: 1.025 (ref 1.005–1.030)
Urobilinogen, Ur: 1 mg/dL (ref 0.2–1.0)
pH, UA: 5.5 (ref 5.0–7.5)

## 2022-07-16 MED ORDER — CIPROFLOXACIN HCL 500 MG PO TABS: 500.0000 mg | ORAL_TABLET | Freq: Once | ORAL | Status: DC

## 2022-07-16 MED ORDER — NITROFURANTOIN MONOHYD MACRO 100 MG PO CAPS: 100.0000 mg | ORAL_CAPSULE | Freq: Two times a day (BID) | ORAL | 3 refills | Status: DC

## 2022-07-16 NOTE — Progress Notes (Signed)
Subjective:  1. 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 Acute cystitis without hematuria   3. BPH with obstruction/lower urinary tract symptoms   4. Incomplete bladder emptying   5. Weak urinary stream     07/16/22: Louis Campos returns today in f/u for a flowrate and cystoscopy.  His PF is only 8m/sec.  He has had cloudy urine and his UA looks infected today but he has no dysuria, hematuria or fever.  His IPSS is 13 with nocturia x 1.  He primarily has a reduced stream with frequency and incomplete emptying.   05/28/22: Louis Campos a 72yo male who has been followed by Dr. SFelipa Eth  He had a urethral stricture that was dilated with an optilume balloon in 5/23 without much improvement in his emptying.  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  He generally carries a PVR of about 3046m   He had a UTI about 2 weeks ago.  He got antibiotic the PA in his PCP's office and did have culture that showed sensitivity.  His UA is clear today.   His PSA in 4/23 was 4.0.  His IPSS is 15 with nocturia x 1 and a sensation of incomplete emptying with a reduced stream and frequency.      IPSS     Row Name 07/16/22 1500         International Prostate Symptom Score   How often have you had the sensation of not emptying your bladder? About half the time     How often have you had to urinate less than every two hours? About half the time     How often have you found you stopped and started again several times when you urinated? Not at All     How often have you found it difficult to postpone urination? Less than 1 in 5 times     How often have you had a weak urinary stream? More than half the time     How often have you had to strain to start urination? Less than 1 in 5 times     How many times did you typically get up at night to urinate? 1 Time     Total IPSS Score 13       Quality of Life due to urinary symptoms   If you were to spend the rest of your life with your urinary  condition just the way it is now how would you feel about that? Mixed                   ROS:  ROS:  A complete review of systems was performed.  All systems are negative except for pertinent findings as noted.   ROS  No Known Allergies  Outpatient Encounter Medications as of 07/16/2022  Medication Sig   ibuprofen (ADVIL) 200 MG tablet Take 200-600 mg by mouth every 6 (six) hours as needed for headache or mild pain.   nitrofurantoin, macrocrystal-monohydrate, (MACROBID) 100 MG capsule Take 1 capsule (100 mg total) by mouth every 12 (twelve) hours.   pantoprazole (PROTONIX) 40 MG tablet Take 1 tablet (40 mg total) by mouth 2 (two) times daily.   silodosin (RAPAFLO) 8 MG CAPS capsule Take 1 capsule (8 mg total) by mouth daily with breakfast.   [DISCONTINUED] ciprofloxacin (CIPRO) tablet 500 mg    No facility-administered encounter medications on file as of 07/16/2022.    Past Medical History:  Diagnosis Date  Acid reflux    patient denies    Past Surgical History:  Procedure Laterality Date   BIOPSY  09/09/2021   Procedure: BIOPSY;  Surgeon: Eloise Harman, DO;  [facility];  Service: Endoscopy;;   CYSTOSCOPY WITH URETHRAL DILATATION N/A 10/08/2021   Procedure: CYSTOSCOPY WITH URETHRAL DILATATION- balloon dilatation with OPTILUME;  Surgeon: Primus Bravo., MD;  Location: [facility];  Service: Urology;  Laterality: N/A;   ESOPHAGEAL BRUSHING  09/09/2021   Procedure: ESOPHAGEAL BRUSHING;  Surgeon: Eloise Harman, DO;  [facility];  Service: Endoscopy;;   ESOPHAGOGASTRODUODENOSCOPY (EGD) WITH PROPOFOL N/A 09/09/2021   Procedure: ESOPHAGOGASTRODUODENOSCOPY (EGD) WITH PROPOFOL;  Surgeon: Eloise Harman, DO;  [facility];  Service: Endoscopy;  Laterality: N/A;  2:15pm   HAND SURGERY     right    Social History   Socioeconomic History   Marital status: Married    Spouse name: Not on file   Number of children: Not on file    Years of education: Not on file   Highest education level: Not on file  Occupational History   Not on file  Tobacco Use   Smoking status: Former    Packs/day: 1.50    Years: 20.00    Total pack years: 30.00    Types: Cigarettes    Quit date: 2000    Years since quitting: 24.1    Passive exposure: Past   Smokeless tobacco: Never  Vaping Use   Vaping Use: Never used  Substance and Sexual Activity   Alcohol use: Yes    Comment: couple of shots every week.   Drug use: Not Currently   Sexual activity: Yes  Other Topics Concern   Not on file  Social History Narrative   Not on file   Social Determinants of Health   Financial Resource Strain: Not on file  Food Insecurity: Not on file  Transportation Needs: Not on file  Physical Activity: Not on file  Stress: Not on file  Social Connections: Not on file  Intimate Partner Violence: Not on file    Family History  Problem Relation Age of Onset   Colon cancer Neg Hx    Esophageal cancer Neg Hx        Objective: Vitals:   07/16/22 1455  BP: (!) 141/82  Pulse: 72     Physical Exam  Lab Results:  PSA No results found for: "PSA" No results found for: "TESTOSTERONE"  UA looks infected.   Studies/Results: No results found. No results found for this or any previous visit.  No results found for this or any previous visit.  No results found for this or any previous visit.  No results found for this or any previous visit.  No results found for this or any previous visit.  No valid procedures specified. No results found for this or any previous visit.   Assessment & Plan: UTI:  The infection appears to have recurred.  I will culture the urine and start macrobid bid for a week and then nightly after a week.    BPH with BOO and urethra stricture s/p dilation.  His flowrate is slow.   I will reschedule cystoscopy for a month to get the infection cleared first.    His previous PSA was 4.  I am going to get a  repeat prior to his next visit.     Meds ordered this encounter  Medications   DISCONTD: ciprofloxacin (CIPRO) tablet 500 mg  nitrofurantoin, macrocrystal-monohydrate, (MACROBID) 100 MG capsule    Sig: Take 1 capsule (100 mg total) by mouth every 12 (twelve) hours.    Dispense:  30 capsule    Refill:  3     Orders Placed This Encounter  Procedures   Urine Culture   Microscopic Examination   Urinalysis, Routine w reflex microscopic   PSA, total and free    Standing Status:   Future    Standing Expiration Date:   10/14/2022   PR COMPLEX UROFLOMETRY   BLADDER SCAN AMB NON-IMAGING      Return in about 4 weeks (around 08/13/2022) for reschedule cystoscopy.  .   CC: Burdine, Virgina Evener, MD      Irine Seal 2/23/2024Patient ID: Louis Campos, male   DOB: 1950/07/02, 72 y.o.   MRN: [id number]

## 2022-07-16 NOTE — Progress Notes (Signed)
Uroflow  Peak Flow: 69m Average Flow: 225mVoided Volume: 1665moiding Time: 15sec Flow Time: 9sec Time to Peak Flow: 41sec  PVR Volume: 258m49m

## 2022-07-21 LAB — URINE CULTURE

## 2022-08-04 ENCOUNTER — Other Ambulatory Visit: Payer: Self-pay | Admitting: Urology

## 2022-08-04 DIAGNOSIS — R339 Retention of urine, unspecified: Secondary | ICD-10-CM

## 2022-08-04 DIAGNOSIS — N401 Enlarged prostate with lower urinary tract symptoms: Secondary | ICD-10-CM

## 2022-08-27 ENCOUNTER — Ambulatory Visit: Payer: Medicare HMO | Admitting: Urology

## 2022-08-27 ENCOUNTER — Encounter: Payer: Self-pay | Admitting: Urology

## 2022-08-27 VITALS — BP 142/89 | HR 64 | Ht 67.5 in | Wt 166.0 lb

## 2022-08-27 DIAGNOSIS — R3912 Poor urinary stream: Secondary | ICD-10-CM

## 2022-08-27 DIAGNOSIS — Z87440 Personal history of urinary (tract) infections: Secondary | ICD-10-CM | POA: Diagnosis not present

## 2022-08-27 DIAGNOSIS — N138 Other obstructive and reflux uropathy: Secondary | ICD-10-CM

## 2022-08-27 DIAGNOSIS — N210 Calculus in bladder: Secondary | ICD-10-CM | POA: Diagnosis not present

## 2022-08-27 DIAGNOSIS — N401 Enlarged prostate with lower urinary tract symptoms: Secondary | ICD-10-CM | POA: Diagnosis not present

## 2022-08-27 DIAGNOSIS — N35912 Unspecified bulbous urethral stricture, male: Secondary | ICD-10-CM | POA: Diagnosis not present

## 2022-08-27 LAB — URINALYSIS, ROUTINE W REFLEX MICROSCOPIC
Bilirubin, UA: NEGATIVE
Glucose, UA: NEGATIVE
Ketones, UA: NEGATIVE
Leukocytes,UA: NEGATIVE
Nitrite, UA: NEGATIVE
Protein,UA: NEGATIVE
RBC, UA: NEGATIVE
Specific Gravity, UA: 1.025 (ref 1.005–1.030)
Urobilinogen, Ur: 0.2 mg/dL (ref 0.2–1.0)
pH, UA: 5.5 (ref 5.0–7.5)

## 2022-08-27 MED ORDER — CIPROFLOXACIN HCL 500 MG PO TABS
500.0000 mg | ORAL_TABLET | Freq: Once | ORAL | Status: AC
  Administered 2022-08-27: 500 mg via ORAL

## 2022-08-27 NOTE — Progress Notes (Signed)
Subjective:  1. 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 Bladder stone   3. BPH with obstruction/lower urinary tract symptoms   4. Weak urinary stream   5. History of UTI     08/27/22: Aeryn returns today in f/u.  He didn't get the PSA ordered. His culture grew enterococcus and he was treated with macrobid.   07/16/22: Nayshawn returns today in f/u for a flowrate and cystoscopy.  His PF is only 69ml/sec.  He has had cloudy urine and his UA looks infected today but he has no dysuria, hematuria or fever.  His IPSS is 13 with nocturia x 1.  He primarily has a reduced stream with frequency and incomplete emptying.   05/28/22: Jaymir is a 72 yo male who has been followed by Dr. Felipa Eth.  He had a urethral stricture that was dilated with an optilume balloon in 5/23 without much improvement in his emptying.  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  He generally carries a PVR of about 329ml.   He had a UTI about 2 weeks ago.  He got antibiotic the PA in his PCP's office and did have culture that showed sensitivity.  His UA is clear today.   His PSA in 4/23 was 4.0.  His IPSS is 15 with nocturia x 1 and a sensation of incomplete emptying with a reduced stream and frequency.             ROS:  ROS:  A complete review of systems was performed.  All systems are negative except for pertinent findings as noted.   ROS  No Known Allergies  Outpatient Encounter Medications as of 08/27/2022  Medication Sig   ibuprofen (ADVIL) 200 MG tablet Take 200-600 mg by mouth every 6 (six) hours as needed for headache or mild pain.   pantoprazole (PROTONIX) 40 MG tablet Take 1 tablet (40 mg total) by mouth 2 (two) times daily.   silodosin (RAPAFLO) 8 MG CAPS capsule Take 1 capsule (8 mg total) by mouth daily with breakfast. NEEDS AN APPT FOR REFILLS   [DISCONTINUED] nitrofurantoin, macrocrystal-monohydrate, (MACROBID) 100 MG capsule Take 1 capsule (100 mg total) by mouth every 12  (twelve) hours.   [EXPIRED] ciprofloxacin (CIPRO) tablet 500 mg    No facility-administered encounter medications on file as of 08/27/2022.    Past Medical History:  Diagnosis Date   Acid reflux    patient denies    Past Surgical History:  Procedure Laterality Date   BIOPSY  09/09/2021   Procedure: BIOPSY;  Surgeon: Eloise Harman, DO;  [facility];  Service: Endoscopy;;   CYSTOSCOPY WITH URETHRAL DILATATION N/A 10/08/2021   Procedure: CYSTOSCOPY WITH URETHRAL DILATATION- balloon dilatation with OPTILUME;  Surgeon: Primus Bravo., MD;  Location: [facility];  Service: Urology;  Laterality: N/A;   ESOPHAGEAL BRUSHING  09/09/2021   Procedure: ESOPHAGEAL BRUSHING;  Surgeon: Eloise Harman, DO;  [facility];  Service: Endoscopy;;   ESOPHAGOGASTRODUODENOSCOPY (EGD) WITH PROPOFOL N/A 09/09/2021   Procedure: ESOPHAGOGASTRODUODENOSCOPY (EGD) WITH PROPOFOL;  Surgeon: Eloise Harman, DO;  [facility];  Service: Endoscopy;  Laterality: N/A;  2:15pm   HAND SURGERY     right    Social History   Socioeconomic History   Marital status: Married    Spouse name: Not on file   Number of children: Not on file   Years of education: Not on file   Highest education level: Not  on file  Occupational History   Not on file  Tobacco Use   Smoking status: Former    Packs/day: 1.50    Years: 20.00    Additional pack years: 0.00    Total pack years: 30.00    Types: Cigarettes    Quit date: 2000    Years since quitting: 24.2    Passive exposure: Past   Smokeless tobacco: Never  Vaping Use   Vaping Use: Never used  Substance and Sexual Activity   Alcohol use: Yes    Comment: couple of shots every week.   Drug use: Not Currently   Sexual activity: Yes  Other Topics Concern   Not on file  Social History Narrative   Not on file   Social Determinants of Health   Financial Resource Strain: Not on file  Food Insecurity: Not on file  Transportation  Needs: Not on file  Physical Activity: Not on file  Stress: Not on file  Social Connections: Not on file  Intimate Partner Violence: Not on file    Family History  Problem Relation Age of Onset   Colon cancer Neg Hx    Esophageal cancer Neg Hx        Objective: Vitals:   08/27/22 1315 08/27/22 1319  BP: (!) 161/86 (!) 142/89  Pulse: 66 64     Physical Exam  Lab Results:  PSA No results found for: "PSA" No results found for: "TESTOSTERONE"  UA is clear Studies/Results: No results found. No results found for this or any previous visit.  No results found for this or any previous visit.  No results found for this or any previous visit.  No results found for this or any previous visit.  No results found for this or any previous visit.  No valid procedures specified. No results found for this or any previous visit.  Procedure: Flexible cystoscopy.  He was prepped with betadine and 2% lidocaine jelly. He was given cipro 500mg  po.  The scope passed easily and the anterior urethra was normal but there is a short segment, moderate to severe bulbar stricture that I couldn't pass.   There were no complications.   Assessment & Plan: UTI:  The infection has cleared.  I will have him stop the macrobid.   BPH with BOO and urethra stricture with a short segment recurrence s/p dilation.  I discussed repeat endoscopic management and urethroplasty but he didn't get a good response to the initial dilation and he may have BPH with BOO as well.  It is difficult to say which is the largest component of the obstruction but he did have trilobar hyperplasia on cystoscopy and a 51ml prostate.  I suggested that a TURP and dilation would be the most definitive approach and reviewed the risks in detail.  He wants to think about that.  Bladder stone.  He had a small bladder stone on his CT in 2/23 and that wasn't commented on in the OP note.  I will get a bladder US to reassess the stone.    His previous PSA was 4.  I am going to get a repeat .     Meds ordered this encounter  Medications   ciprofloxacin (CIPRO) tablet 500 mg     Orders Placed This Encounter  Procedures   US Pelvis Limited    Attention to any bladder stones that might be present.    Standing Status:   Future    Standing Expiration Date:   08/27/2023  Order Specific Question:   Reason for Exam (SYMPTOM  OR DIAGNOSIS REQUIRED)    Answer:   bladder stone.  Reassess size    Order Specific Question:   Preferred imaging location?    Answer:   [facility]   Urinalysis, Routine w reflex microscopic   PSA, total and free      Return in about 3 months (around 11/26/2022).   CC: Burdine, [name], MD      Bjorn Pippin 4/5/2024Patient ID: Sanda Klein, male   DOB: Jul 21, 1950, 72 y.o.   MRN: [id number]

## 2022-08-28 LAB — PSA, TOTAL AND FREE
PSA, Free Pct: 19.1 %
PSA, Free: 0.86 ng/mL
Prostate Specific Ag, Serum: 4.5 ng/mL — ABNORMAL HIGH (ref 0.0–4.0)

## 2022-09-01 ENCOUNTER — Telehealth: Payer: Self-pay

## 2022-09-01 NOTE — Telephone Encounter (Signed)
I spoke to patient, he does not want to recheck labs as recommended by MD.  He will keep his scheduled follow up on 07/11.

## 2022-09-01 NOTE — Telephone Encounter (Signed)
-----   Message from Bjorn Pippin, MD sent at 09/01/2022 11:29 AM EDT ----- His PSA was up some but with the recent UTI, it would be worthwhile to get a repeat in about 6 weeks before determining the need for additional testing.  Please get that set up.  ----- Message ----- From: Grier Rocher, CMA Sent: 08/31/2022   9:36 AM EDT To: Bjorn Pippin, MD  Please review.

## 2022-09-08 ENCOUNTER — Ambulatory Visit (HOSPITAL_COMMUNITY)
Admission: RE | Admit: 2022-09-08 | Discharge: 2022-09-08 | Disposition: A | Discharge status: Home / Self Care | Payer: Medicare HMO | Source: Ambulatory Visit | Attending: Urology | Admitting: Urology

## 2022-09-08 DIAGNOSIS — N210 Calculus in bladder: Secondary | ICD-10-CM | POA: Diagnosis not present

## 2022-09-08 DIAGNOSIS — N3289 Other specified disorders of bladder: Secondary | ICD-10-CM | POA: Diagnosis not present

## 2022-09-14 ENCOUNTER — Telehealth: Payer: Self-pay

## 2022-09-14 NOTE — Telephone Encounter (Signed)
Called patient to inform him of MD response to results.  Left vm to return call for results.  Results posted in Mychart, pt has not viewed.

## 2022-09-14 NOTE — Telephone Encounter (Signed)
-----   Message from Bjorn Pippin, MD sent at 09/10/2022  8:57 AM EDT ----- He is not emptying that well but he has no recurrent bladder stones.  F/u as planned.  ----- Message ----- From: Grier Rocher, CMA Sent: 09/09/2022  11:23 AM EDT To: Bjorn Pippin, MD  Please review

## 2022-10-01 ENCOUNTER — Ambulatory Visit: Payer: Medicare HMO | Admitting: Urology

## 2022-10-01 ENCOUNTER — Encounter: Payer: Self-pay | Admitting: Urology

## 2022-10-01 VITALS — BP 154/83 | HR 67 | Ht 67.0 in | Wt 155.0 lb

## 2022-10-01 DIAGNOSIS — N138 Other obstructive and reflux uropathy: Secondary | ICD-10-CM

## 2022-10-01 DIAGNOSIS — R972 Elevated prostate specific antigen [PSA]: Secondary | ICD-10-CM

## 2022-10-01 DIAGNOSIS — Z87440 Personal history of urinary (tract) infections: Secondary | ICD-10-CM | POA: Diagnosis not present

## 2022-10-01 DIAGNOSIS — N35912 Unspecified bulbous urethral stricture, male: Secondary | ICD-10-CM

## 2022-10-01 DIAGNOSIS — N401 Enlarged prostate with lower urinary tract symptoms: Secondary | ICD-10-CM | POA: Diagnosis not present

## 2022-10-01 MED ORDER — NITROFURANTOIN MONOHYD MACRO 100 MG PO CAPS: 100.0000 mg | ORAL_CAPSULE | Freq: Every day | ORAL | 2 refills | Status: DC

## 2022-10-01 NOTE — H&P (View-Only) (Signed)
 Assessment: 1. 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 BPH with obstruction/lower urinary tract symptoms   3. History of UTI   4. Elevated PSA     Plan: I discussed options for management of his recurrent bulbar urethral stricture and BPH with obstruction.   I advised him that it is difficult to determine if his urinary symptoms are related to the urethral stricture or to BPH.  He likely has a component of both issues causing his symptoms. Specifically, I discussed the options of initial management of the bulbar stricture with DVIU and dilation versus management of both problems with dilation and TURP.  He would like to begin with the least invasive procedure which would be the DVIU and balloon dilation. Recommend that he resume daily Macrobid for UTI prevention. Will schedule for cystoscopy, DVIU and balloon dilation of bulbar urethral stricture.  Today I had a long discussion with the patient regarding PSA and the rationale and controversies of prostate cancer early detection.  I discussed the pros and cons of further evaluation including TRUS and prostate Bx.  Potential adverse events and complications as well as standard instructions were given.  Patient expressed his understanding of these issues. I recommended that he consider prostate biopsy. He would like to monitor at this time.  Procedure: The patient will be scheduled for cystoscopy, DVIU, balloon dilation of bulbar urethral stricture at [place].  Surgical request is placed with the surgery schedulers and will be scheduled at the patient's/family request. Informed consent is given as documented below. Anesthesia: General  The patient does not have sleep apnea, history of MRSA, history of VRE, history of cardiac device requiring special anesthetic needs. Patient is stable and considered clear for surgical in an outpatient ambulatory surgery setting as well as patient hospital setting.  Consent  for Operation or Procedure: Provider Certification I hereby certify that the nature, purpose, benefits, usual and most frequent risks of, and alternatives to, the operation or procedure have been explained to the patient (or person authorized to sign for the patient) either by me as responsible physician or by the provider who is to perform the operation or procedure. Time spent such that the patient/family has had an opportunity to ask questions, and that those questions have been answered. The patient or the patient's representative has been advised that selected tasks may be performed by assistants to the primary health care provider(s). I believe that the patient (or person authorized to sign for the patient) understands what has been explained, and has consented to the operation or procedure. No guarantees were implied or made.   Chief Complaint:  Chief Complaint  Patient presents with   stricture of bolbus    History of Present Illness:  Louis Campos is a 72 y.o. year old male who is seen for further evaluation of lower urinary tract symptoms.  He has a history of UTIs approximately once a year for several years beginning in 2014.  These resolved spontaneously.  About 1 year ago,  he noted foul-smelling and cloudy urine.  He was tested for UTI and by report his culture was negative.  He continued to have foul-smelling and cloudy urine.  He also reported urinary urgency at times, slight discomfort with voiding, weak stream, and nocturia 1-2 times. No gross hematuria or flank pain. IPSS = 13.  PVR = 350 mL. CT renal stone study from 07/11/2021 showed a small nonobstructing right renal stone, small calculus in the bladder, no   evidence of renal mass or obstruction. Urine culture from 2/23 grew 25-50K staph haemolyticus. Treated with macrobid.  At his return visit in March 2023, he continued on alfuzosin.  He noted an improvement in the color and odor of his urine.  He continued to have a decreased  stream, frequency, and sensation of incomplete emptying.  No dysuria or gross hematuria. IPSS = 15.  PVR = 330 ml. He was changed to silodosin 8 mg daily.  He continued on silodosin without significant change in his urinary symptoms.  He continued to have a weak stream, sensation of incomplete emptying, frequency and urgency. IPSS = 17. Cystoscopy demonstrated a narrow bulbar urethral stricture which would not allow passage of the cystoscope.  He underwent balloon dilation of the urethral stricture on 10/08/2021.  A OptiLume balloon was used.  He was discharged with a catheter in place. He presented for a voiding trial on 10/13/2021.  He noted gross hematuria beginning 10/12/2021..   His catheter was removed but he was unable to void and returned later that afternoon in retention.  A Foley catheter was placed with return of 750 mL of dark bloody urine.  The catheter was irrigated with return of multiple small clots. His catheter was removed on 10/16/2021 after a successful voiding trial.  At his visit in 6/23, he was voiding spontaneously after foley removal. No gross hematuria.  No dysuria.  He continued with some frequency and urgency.  No significant change in his stream. PVR = 279 ml.  At his visit in 7/23, he continued to report frequency, decreased force of stream, and sensation of incomplete emptying.  His symptoms were primarily in the morning.  He had nocturia 0-1 time per night.  No dysuria or gross hematuria.  He continues on silodosin. IPSS = 14. PVR = 267 ml  He was seen by Dr. Wrenn in January 2024 following a UTI.  He had a flow rate and PVR in February 2024 which showed a peak flow of 4 mL/s.  Cystoscopy in April 2024 showed a normal anterior urethra but a short segment moderate to severe bulbar stricture that would not allow passage of the scope. Pelvic ultrasound from 09/08/2022 showed a postvoid volume of 371 mL.  No stones were seen within the bladder. PSA from 08/27/2022 was 4.5  with free PSA of 19.1%.    He returns today for follow-up.  He continues to have lower urinary tract symptoms including weak stream and sensation of incomplete emptying.  He also has urinary frequency.  No dysuria or gross hematuria.  No current UTI symptoms.  He is not currently on an antibiotic.  He would like to discuss options for management of his lower urinary tract symptoms. IPSS = 17 today.  Portions of the above documentation were copied from a prior visit for review purposes only.  Past Medical History:  Past Medical History:  Diagnosis Date   Acid reflux    patient denies    Past Surgical History:  Past Surgical History:  Procedure Laterality Date   BIOPSY  09/09/2021   Procedure: BIOPSY;  Surgeon: Carver, Charles K, DO;  [facility];  Service: Endoscopy;;   CYSTOSCOPY WITH URETHRAL DILATATION N/A 10/08/2021   Procedure: CYSTOSCOPY WITH URETHRAL DILATATION- balloon dilatation with OPTILUME;  Surgeon: Amoree Newlon J., MD;  Location: [facility];  Service: Urology;  Laterality: N/A;   ESOPHAGEAL BRUSHING  09/09/2021   Procedure: ESOPHAGEAL BRUSHING;  Surgeon: Carver, Charles K, DO;  [facility];  Service: Endoscopy;;   ESOPHAGOGASTRODUODENOSCOPY (EGD) WITH PROPOFOL N/A 09/09/2021   Procedure: ESOPHAGOGASTRODUODENOSCOPY (EGD) WITH PROPOFOL;  Surgeon: Carver, Charles K, DO;  [facility];  Service: Endoscopy;  Laterality: N/A;  2:15pm   HAND SURGERY     right    Allergies:  No Known Allergies  Family History:  Family History  Problem Relation Age of Onset   Colon cancer Neg Hx    Esophageal cancer Neg Hx     Social History:  Social History   Tobacco Use   Smoking status: Former    Packs/day: 1.50    Years: 20.00    Additional pack years: 0.00    Total pack years: 30.00    Types: Cigarettes    Quit date: 2000    Years since quitting: 24.3    Passive exposure: Past   Smokeless tobacco: Never  Vaping Use   Vaping Use: Never  used  Substance Use Topics   Alcohol use: Yes    Comment: couple of shots every week.   Drug use: Not Currently    ROS: Constitutional:  Negative for fever, chills, weight loss CV: Negative for chest pain, previous MI, hypertension Respiratory:  Negative for shortness of breath, wheezing, sleep apnea, frequent cough GI:  Negative for nausea, vomiting, bloody stool, GERD  Physical exam: BP (!) 154/83   Pulse 67   Ht 5' 7" (1.702 m)   Wt 155 lb (70.3 kg)   BMI 24.28 kg/m  GENERAL APPEARANCE:  Well appearing, well developed, well nourished, NAD HEENT:  Atraumatic, normocephalic, oropharynx clear NECK:  Supple without lymphadenopathy or thyromegaly ABDOMEN:  Soft, non-tender, no masses EXTREMITIES:  Moves all extremities well, without clubbing, cyanosis, or edema NEUROLOGIC:  Alert and oriented x 3, normal gait, CN II-XII grossly intact MENTAL STATUS:  appropriate BACK:  Non-tender to palpation, No CVAT SKIN:  Warm, dry, and intact  Results: U/A: trace glucose 

## 2022-10-01 NOTE — Progress Notes (Signed)
Assessment: 1. 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 BPH with obstruction/lower urinary tract symptoms   3. History of UTI   4. Elevated PSA     Plan: I discussed options for management of his recurrent bulbar urethral stricture and BPH with obstruction.   I advised him that it is difficult to determine if his urinary symptoms are related to the urethral stricture or to BPH.  He likely has a component of both issues causing his symptoms. Specifically, I discussed the options of initial management of the bulbar stricture with DVIU and dilation versus management of both problems with dilation and TURP.  He would like to begin with the least invasive procedure which would be the DVIU and balloon dilation. Recommend that he resume daily Macrobid for UTI prevention. Will schedule for cystoscopy, DVIU and balloon dilation of bulbar urethral stricture.  Today I had a long discussion with the patient regarding PSA and the rationale and controversies of prostate cancer early detection.  I discussed the pros and cons of further evaluation including TRUS and prostate Bx.  Potential adverse events and complications as well as standard instructions were given.  Patient expressed his understanding of these issues. I recommended that he consider prostate biopsy. He would like to monitor at this time.  Procedure: The patient will be scheduled for cystoscopy, DVIU, balloon dilation of bulbar urethral stricture at [facility].  Surgical request is placed with the surgery schedulers and will be scheduled at the patient's/family request. Informed consent is given as documented below. Anesthesia: General  The patient does not have sleep apnea, history of MRSA, history of VRE, history of cardiac device requiring special anesthetic needs. Patient is stable and considered clear for surgical in an outpatient ambulatory surgery setting as well as patient hospital setting.  Consent  for Operation or Procedure: Provider Certification I hereby certify that the nature, purpose, benefits, usual and most frequent risks of, and alternatives to, the operation or procedure have been explained to the patient (or person authorized to sign for the patient) either by me as responsible physician or by the provider who is to perform the operation or procedure. Time spent such that the patient/family has had an opportunity to ask questions, and that those questions have been answered. The patient or the patient's representative has been advised that selected tasks may be performed by assistants to the primary health care provider(s). I believe that the patient (or person authorized to sign for the patient) understands what has been explained, and has consented to the operation or procedure. No guarantees were implied or made.   Chief Complaint:  Chief Complaint  Patient presents with   stricture of bolbus    History of Present Illness:  Louis Campos is a 72 y.o. year old male who is seen for further evaluation of lower urinary tract symptoms.  He has a history of UTIs approximately once a year for several years beginning in 2014.  These resolved spontaneously.  About 1 year ago,  he noted foul-smelling and cloudy urine.  He was tested for UTI and by report his culture was negative.  He continued to have foul-smelling and cloudy urine.  He also reported urinary urgency at times, slight discomfort with voiding, weak stream, and nocturia 1-2 times. No gross hematuria or flank pain. IPSS = 13.  PVR = 350 mL. CT renal stone study from 07/11/2021 showed a small nonobstructing right renal stone, small calculus in the bladder, no  evidence of renal mass or obstruction. Urine culture from 2/23 grew 25-50K staph haemolyticus. Treated with macrobid.  At his return visit in March 2023, he continued on alfuzosin.  He noted an improvement in the color and odor of his urine.  He continued to have a decreased  stream, frequency, and sensation of incomplete emptying.  No dysuria or gross hematuria. IPSS = 15.  PVR = 330 ml. He was changed to silodosin 8 mg daily.  He continued on silodosin without significant change in his urinary symptoms.  He continued to have a weak stream, sensation of incomplete emptying, frequency and urgency. IPSS = 17. Cystoscopy demonstrated a narrow bulbar urethral stricture which would not allow passage of the cystoscope.  He underwent balloon dilation of the urethral stricture on 10/08/2021.  A OptiLume balloon was used.  He was discharged with a catheter in place. He presented for a voiding trial on 10/13/2021.  He noted gross hematuria beginning 10/12/2021.[name]   His catheter was removed but he was unable to void and returned later that afternoon in retention.  A Foley catheter was placed with return of 750 mL of dark bloody urine.  The catheter was irrigated with return of multiple small clots. His catheter was removed on 10/16/2021 after a successful voiding trial.  At his visit in 6/23, he was voiding spontaneously after foley removal. No gross hematuria.  No dysuria.  He continued with some frequency and urgency.  No significant change in his stream. PVR = 279 ml.  At his visit in 7/23, he continued to report frequency, decreased force of stream, and sensation of incomplete emptying.  His symptoms were primarily in the morning.  He had nocturia 0-1 time per night.  No dysuria or gross hematuria.  He continues on silodosin. IPSS = 14. PVR = 267 ml  He was seen by Dr. Annabell Howells in January 2024 following a UTI.  He had a flow rate and PVR in February 2024 which showed a peak flow of 4 mL/s.  Cystoscopy in April 2024 showed a normal anterior urethra but a short segment moderate to severe bulbar stricture that would not allow passage of the scope. Pelvic ultrasound from 09/08/2022 showed a postvoid volume of 371 mL.  No stones were seen within the bladder. PSA from 08/27/2022 was 4.5  with free PSA of 19.1%.    He returns today for follow-up.  He continues to have lower urinary tract symptoms including weak stream and sensation of incomplete emptying.  He also has urinary frequency.  No dysuria or gross hematuria.  No current UTI symptoms.  He is not currently on an antibiotic.  He would like to discuss options for management of his lower urinary tract symptoms. IPSS = 17 today.  Portions of the above documentation were copied from a prior visit for review purposes only.  Past Medical History:  Past Medical History:  Diagnosis Date   Acid reflux    patient denies    Past Surgical History:  Past Surgical History:  Procedure Laterality Date   BIOPSY  09/09/2021   Procedure: BIOPSY;  Surgeon: Lanelle Bal, DO;  [facility];  Service: Endoscopy;;   CYSTOSCOPY WITH URETHRAL DILATATION N/A 10/08/2021   Procedure: CYSTOSCOPY WITH URETHRAL DILATATION- balloon dilatation with OPTILUME;  Surgeon: Milderd Meager., MD;  Location: [facility];  Service: Urology;  Laterality: N/A;   ESOPHAGEAL BRUSHING  09/09/2021   Procedure: ESOPHAGEAL BRUSHING;  Surgeon: Lanelle Bal, DO;  [facility];  Service: Endoscopy;;   ESOPHAGOGASTRODUODENOSCOPY (EGD) WITH PROPOFOL N/A 09/09/2021   Procedure: ESOPHAGOGASTRODUODENOSCOPY (EGD) WITH PROPOFOL;  Surgeon: Lanelle Bal, DO;  [facility];  Service: Endoscopy;  Laterality: N/A;  2:15pm   HAND SURGERY     right    Allergies:  No Known Allergies  Family History:  Family History  Problem Relation Age of Onset   Colon cancer Neg Hx    Esophageal cancer Neg Hx     Social History:  Social History   Tobacco Use   Smoking status: Former    Packs/day: 1.50    Years: 20.00    Additional pack years: 0.00    Total pack years: 30.00    Types: Cigarettes    Quit date: 2000    Years since quitting: 24.3    Passive exposure: Past   Smokeless tobacco: Never  Vaping Use   Vaping Use: Never  used  Substance Use Topics   Alcohol use: Yes    Comment: couple of shots every week.   Drug use: Not Currently    ROS: Constitutional:  Negative for fever, chills, weight loss CV: Negative for chest pain, previous MI, hypertension Respiratory:  Negative for shortness of breath, wheezing, sleep apnea, frequent cough GI:  Negative for nausea, vomiting, bloody stool, GERD  Physical exam: BP (!) 154/83   Pulse 67   Ht 5\' 7"  (1.702 m)   Wt 155 lb (70.3 kg)   BMI 24.28 kg/m  GENERAL APPEARANCE:  Well appearing, well developed, well nourished, NAD HEENT:  Atraumatic, normocephalic, oropharynx clear NECK:  Supple without lymphadenopathy or thyromegaly ABDOMEN:  Soft, non-tender, no masses EXTREMITIES:  Moves all extremities well, without clubbing, cyanosis, or edema NEUROLOGIC:  Alert and oriented x 3, normal gait, CN II-XII grossly intact MENTAL STATUS:  appropriate BACK:  Non-tender to palpation, No CVAT SKIN:  Warm, dry, and intact  Results: U/A: trace glucose

## 2022-10-02 ENCOUNTER — Other Ambulatory Visit (HOSPITAL_COMMUNITY): Payer: Self-pay

## 2022-10-02 LAB — URINALYSIS, ROUTINE W REFLEX MICROSCOPIC
Bilirubin, UA: NEGATIVE
Ketones, UA: NEGATIVE
Leukocytes,UA: NEGATIVE
Nitrite, UA: NEGATIVE
Protein,UA: NEGATIVE
RBC, UA: NEGATIVE
Specific Gravity, UA: 1.025 (ref 1.005–1.030)
Urobilinogen, Ur: 0.2 mg/dL (ref 0.2–1.0)
pH, UA: 6 (ref 5.0–7.5)

## 2022-10-02 NOTE — Patient Instructions (Signed)
SURGICAL WAITING ROOM VISITATION  Patients having surgery or a procedure may have no more than 2 support people in the waiting area - these visitors may rotate.    Children under the age of 43 must have an adult with them who is not the patient.  If the patient needs to stay at the hospital during part of their recovery, the visitor guidelines for inpatient rooms apply.  Pre-op nurse will coordinate an appropriate time for 1 support person to accompany patient in pre-op.  This support person may not rotate.    Please refer to the [facility] website for the visitor guidelines for Inpatients (after your surgery is over and you are in a regular room).       Your procedure is scheduled on: 10-13-22   Report to [facility] Main Entrance    Report to admitting at     1145 AM   Call this number if you have problems the morning of surgery [phone]   Do not eat food or drink liquids :After Midnight.   After Midnight you may have the following liquids until ______ AM/ PM DAY OF SURGERY  Water Non-Citrus Juices (without pulp, NO RED-Apple, White grape, White cranberry) Black Coffee (NO MILK/CREAM OR CREAMERS, sugar ok)  Clear Tea (NO MILK/CREAM OR CREAMERS, sugar ok) regular and decaf                             Plain Jell-O (NO RED)                                           Fruit ices (not with fruit pulp, NO RED)                                     Popsicles (NO RED)                                                               Sports drinks like Gatorade (NO RED)                   The day of surgery:  Drink ONE (1) Pre-Surgery Clear Ensure or G2 at AM the morning of surgery. Drink in one sitting. Do not sip.  This drink was given to you during your hospital  pre-op appointment visit. Nothing else to drink after completing the  Pre-Surgery Clear Ensure or G2.          If you have questions, please contact your surgeon's office.   FOLLOW ANY ADDITIONAL PRE OP  INSTRUCTIONS YOU RECEIVED FROM YOUR SURGEON'S OFFICE!!!     Oral Hygiene is also important to reduce your risk of infection.                                    Remember - BRUSH YOUR TEETH THE MORNING OF SURGERY WITH YOUR REGULAR TOOTHPASTE  DENTURES WILL BE REMOVED PRIOR TO SURGERY PLEASE DO NOT APPLY "Poly grip" OR  ADHESIVES!!!   Do NOT smoke after Midnight   Take these medicines the morning of surgery with A SIP OF WATER: macrobid, pantoprazole, silodosin    Bring CPAP mask and tubing day of surgery.                              You may not have any metal on your body including hair pins, jewelry, and body piercing             Do not wear , lotions, powders, perfumes/cologne, or deodorant                Men may shave face and neck.   Do not bring valuables to the hospital. [facility] IS NOT             RESPONSIBLE   FOR VALUABLES.   Contacts, glasses, dentures or bridgework may not be worn into surgery.     DO NOT BRING YOUR HOME MEDICATIONS TO THE HOSPITAL. PHARMACY WILL DISPENSE MEDICATIONS LISTED ON YOUR MEDICATION LIST TO YOU DURING YOUR ADMISSION IN THE HOSPITAL!    Patients discharged on the day of surgery will not be allowed to drive home.  Someone NEEDS to stay with you for the first 24 hours after anesthesia.   Special Instructions:If you have one  Bring a copy of your healthcare power of attorney and living will documents the day of surgery if you haven't scanned them before.              Please read over the following fact sheets you were given: IF YOU HAVE QUESTIONS ABOUT YOUR PRE-OP INSTRUCTIONS PLEASE CALL [phone]   . If you test positive for Covid or have been in contact with anyone that has tested positive in the last 10 days please notify you surgeon.    [facility] - Preparing for Surgery Before surgery, you can play an important role.  Because skin is not sterile, your skin needs to be as free of germs as possible.  You can reduce the number of  germs on your skin by washing with CHG (chlorahexidine gluconate) soap before surgery.  CHG is an antiseptic cleaner which kills germs and bonds with the skin to continue killing germs even after washing. Please DO NOT use if you have an allergy to CHG or antibacterial soaps.  If your skin becomes reddened/irritated stop using the CHG and inform your nurse when you arrive at Short Stay. Do not shave (including legs and underarms) for at least 48 hours prior to the first CHG shower.  You may shave your face/neck. Please follow these instructions carefully:  1.  Shower with CHG Soap the night before surgery and the  morning of Surgery.  2.  If you choose to wash your hair, wash your hair first as usual with your  normal  shampoo.  3.  After you shampoo, rinse your hair and body thoroughly to remove the  shampoo.                           4.  Use CHG as you would any other liquid soap.  You can apply chg directly  to the skin and wash                       Gently with a scrungie or clean washcloth.  5.  Apply the CHG  Soap to your body ONLY FROM THE NECK DOWN.   Do not use on face/ open                           Wound or open sores. Avoid contact with eyes, ears mouth and genitals (private parts).                       Wash face,  Genitals (private parts) with your normal soap.             6.  Wash thoroughly, paying special attention to the area where your surgery  will be performed.  7.  Thoroughly rinse your body with warm water from the neck down.  8.  DO NOT shower/wash with your normal soap after using and rinsing off  the CHG Soap.                9.  Pat yourself dry with a clean towel.            10.  Wear clean pajamas.            11.  Place clean sheets on your bed the night of your first shower and do not  sleep with pets. Day of Surgery : Do not apply any lotions/deodorants the morning of surgery.  Please wear clean clothes to the hospital/surgery center.  FAILURE TO FOLLOW THESE  INSTRUCTIONS MAY RESULT IN THE CANCELLATION OF YOUR SURGERY PATIENT SIGNATURE_________________________________  NURSE SIGNATURE__________________________________  ________________________________________________________________________

## 2022-10-02 NOTE — Progress Notes (Signed)
PCP - Quintin Alto, MD Cardiologist - no  PPM/ICD -  Device Orders -  Rep Notified -   Chest x-ray -  EKG -  Stress Test -  ECHO -  Cardiac Cath -   Sleep Study -  CPAP -   Fasting Blood Sugar -  Checks Blood Sugar _____ times a day  Blood Thinner Instructions: Aspirin Instructions:  ERAS Protcol - PRE-SURGERY N/A    COVID vaccine -x2  Activity--Able to walk 2 miles every morning without SOB or CP Anesthesia review:   Patient denies shortness of breath, fever, cough and chest pain at PAT appointment   All instructions explained to the patient, with a verbal understanding of the material. Patient agrees to go over the instructions while at home for a better understanding. Patient also instructed to self quarantine after being tested for COVID-19. The opportunity to ask questions was provided.

## 2022-10-02 NOTE — Progress Notes (Signed)
Please place orders in epic pt. Is scheduled for preop 

## 2022-10-05 ENCOUNTER — Other Ambulatory Visit: Payer: Self-pay | Admitting: Urology

## 2022-10-05 ENCOUNTER — Ambulatory Visit: Payer: Self-pay | Admitting: Urology

## 2022-10-05 DIAGNOSIS — N35912 Unspecified bulbous urethral stricture, male: Secondary | ICD-10-CM

## 2022-10-06 ENCOUNTER — Other Ambulatory Visit: Payer: Self-pay

## 2022-10-06 ENCOUNTER — Encounter (HOSPITAL_COMMUNITY)
Admission: RE | Admit: 2022-10-06 | Discharge: 2022-10-06 | Disposition: A | Discharge status: Home / Self Care | Payer: Medicare HMO | Source: Ambulatory Visit | Attending: Urology | Admitting: Urology

## 2022-10-06 ENCOUNTER — Encounter (HOSPITAL_COMMUNITY): Payer: Self-pay

## 2022-10-06 VITALS — BP 144/90 | HR 65 | Temp 98.4°F | Resp 16 | Ht 67.0 in | Wt 160.0 lb

## 2022-10-06 DIAGNOSIS — Z01812 Encounter for preprocedural laboratory examination: Secondary | ICD-10-CM | POA: Insufficient documentation

## 2022-10-06 DIAGNOSIS — Z01818 Encounter for other preprocedural examination: Secondary | ICD-10-CM

## 2022-10-06 HISTORY — DX: Pneumonia, unspecified organism: J18.9

## 2022-10-06 LAB — CBC
HCT: 36.7 % — ABNORMAL LOW (ref 39.0–52.0)
Hemoglobin: 12 g/dL — ABNORMAL LOW (ref 13.0–17.0)
MCH: 30.1 pg (ref 26.0–34.0)
MCHC: 32.7 g/dL (ref 30.0–36.0)
MCV: 92 fL (ref 80.0–100.0)
Platelets: 229 10*3/uL (ref 150–400)
RBC: 3.99 MIL/uL — ABNORMAL LOW (ref 4.22–5.81)
RDW: 11.6 % (ref 11.5–15.5)
WBC: 3.8 10*3/uL — ABNORMAL LOW (ref 4.0–10.5)
nRBC: 0 % (ref 0.0–0.2)

## 2022-10-13 ENCOUNTER — Other Ambulatory Visit: Payer: Medicare HMO

## 2022-10-13 ENCOUNTER — Ambulatory Visit (HOSPITAL_COMMUNITY): Payer: Medicare HMO | Admitting: Certified Registered"

## 2022-10-13 ENCOUNTER — Encounter (HOSPITAL_COMMUNITY): Payer: Self-pay | Admitting: Urology

## 2022-10-13 ENCOUNTER — Encounter (HOSPITAL_COMMUNITY)
Admission: RE | Disposition: A | Discharge status: Home / Self Care | Payer: Self-pay | Source: Home / Self Care | Attending: Urology

## 2022-10-13 ENCOUNTER — Ambulatory Visit (HOSPITAL_COMMUNITY)
Admission: RE | Admit: 2022-10-13 | Discharge: 2022-10-13 | Disposition: A | Discharge status: Home / Self Care | Payer: Medicare HMO | Attending: Urology | Admitting: Urology

## 2022-10-13 ENCOUNTER — Ambulatory Visit (HOSPITAL_BASED_OUTPATIENT_CLINIC_OR_DEPARTMENT_OTHER): Payer: Medicare HMO | Admitting: Certified Registered"

## 2022-10-13 ENCOUNTER — Ambulatory Visit (HOSPITAL_COMMUNITY): Payer: Medicare HMO

## 2022-10-13 DIAGNOSIS — N401 Enlarged prostate with lower urinary tract symptoms: Secondary | ICD-10-CM | POA: Insufficient documentation

## 2022-10-13 DIAGNOSIS — Z87440 Personal history of urinary (tract) infections: Secondary | ICD-10-CM | POA: Insufficient documentation

## 2022-10-13 DIAGNOSIS — Z87891 Personal history of nicotine dependence: Secondary | ICD-10-CM

## 2022-10-13 DIAGNOSIS — C61 Malignant neoplasm of prostate: Secondary | ICD-10-CM | POA: Diagnosis not present

## 2022-10-13 DIAGNOSIS — N138 Other obstructive and reflux uropathy: Secondary | ICD-10-CM | POA: Diagnosis not present

## 2022-10-13 DIAGNOSIS — N35919 Unspecified urethral stricture, male, unspecified site: Secondary | ICD-10-CM | POA: Diagnosis not present

## 2022-10-13 DIAGNOSIS — K219 Gastro-esophageal reflux disease without esophagitis: Secondary | ICD-10-CM | POA: Insufficient documentation

## 2022-10-13 DIAGNOSIS — N35912 Unspecified bulbous urethral stricture, male: Secondary | ICD-10-CM | POA: Insufficient documentation

## 2022-10-13 DIAGNOSIS — N400 Enlarged prostate without lower urinary tract symptoms: Secondary | ICD-10-CM | POA: Diagnosis not present

## 2022-10-13 HISTORY — PX: CYSTOSCOPY WITH URETHRAL DILATATION: SHX5125

## 2022-10-13 HISTORY — PX: BALLOON DILATION: SHX5330

## 2022-10-13 SURGERY — BALLOON DILATION
Anesthesia: General | Laterality: Bilateral

## 2022-10-13 MED ORDER — SODIUM CHLORIDE 0.9 % IR SOLN
Status: DC | PRN
  Administered 2022-10-13: 6000 mL

## 2022-10-13 MED ORDER — LIDOCAINE 2% (20 MG/ML) 5 ML SYRINGE
INTRAMUSCULAR | Status: DC | PRN
  Administered 2022-10-13: 40 mg via INTRAVENOUS

## 2022-10-13 MED ORDER — OXYCODONE HCL 5 MG PO TABS
ORAL_TABLET | ORAL | Status: AC
  Filled 2022-10-13: qty 1

## 2022-10-13 MED ORDER — MIDAZOLAM HCL 2 MG/2ML IJ SOLN
INTRAMUSCULAR | Status: AC
  Filled 2022-10-13: qty 2

## 2022-10-13 MED ORDER — FENTANYL CITRATE (PF) 250 MCG/5ML IJ SOLN
INTRAMUSCULAR | Status: DC | PRN
  Administered 2022-10-13: 50 ug via INTRAVENOUS
  Administered 2022-10-13: 25 ug via INTRAVENOUS

## 2022-10-13 MED ORDER — MIDAZOLAM HCL 2 MG/2ML IJ SOLN: 0.5000 mg | Freq: Once | INTRAMUSCULAR | Status: DC | PRN

## 2022-10-13 MED ORDER — FENTANYL CITRATE (PF) 100 MCG/2ML IJ SOLN
INTRAMUSCULAR | Status: AC
  Filled 2022-10-13: qty 2

## 2022-10-13 MED ORDER — PROPOFOL 10 MG/ML IV BOLUS
INTRAVENOUS | Status: DC | PRN
  Administered 2022-10-13: 200 mg via INTRAVENOUS

## 2022-10-13 MED ORDER — DEXAMETHASONE SODIUM PHOSPHATE 10 MG/ML IJ SOLN
INTRAMUSCULAR | Status: DC | PRN
  Administered 2022-10-13: 10 mg via INTRAVENOUS

## 2022-10-13 MED ORDER — OXYCODONE HCL 5 MG/5ML PO SOLN: 5.0000 mg | Freq: Once | ORAL | Status: AC | PRN

## 2022-10-13 MED ORDER — CHLORHEXIDINE GLUCONATE 0.12 % MT SOLN
15.0000 mL | Freq: Once | OROMUCOSAL | Status: AC
  Administered 2022-10-13: 15 mL via OROMUCOSAL

## 2022-10-13 MED ORDER — ONDANSETRON HCL 4 MG/2ML IJ SOLN
INTRAMUSCULAR | Status: AC
  Filled 2022-10-13: qty 2

## 2022-10-13 MED ORDER — ACETAMINOPHEN 500 MG PO TABS
1000.0000 mg | ORAL_TABLET | Freq: Once | ORAL | Status: AC
  Administered 2022-10-13: 1000 mg via ORAL
  Filled 2022-10-13: qty 2

## 2022-10-13 MED ORDER — IOHEXOL 300 MG/ML  SOLN
INTRAMUSCULAR | Status: DC | PRN
  Administered 2022-10-13: 10 mL

## 2022-10-13 MED ORDER — MIDAZOLAM HCL 5 MG/5ML IJ SOLN
INTRAMUSCULAR | Status: DC | PRN
  Administered 2022-10-13: 2 mg via INTRAVENOUS

## 2022-10-13 MED ORDER — DEXAMETHASONE SODIUM PHOSPHATE 10 MG/ML IJ SOLN
INTRAMUSCULAR | Status: AC
  Filled 2022-10-13: qty 1

## 2022-10-13 MED ORDER — PROPOFOL 10 MG/ML IV BOLUS
INTRAVENOUS | Status: AC
  Filled 2022-10-13: qty 20

## 2022-10-13 MED ORDER — OXYCODONE HCL 5 MG PO TABS
5.0000 mg | ORAL_TABLET | Freq: Once | ORAL | Status: AC | PRN
  Administered 2022-10-13: 5 mg via ORAL

## 2022-10-13 MED ORDER — PROMETHAZINE HCL 25 MG/ML IJ SOLN: 6.2500 mg | INTRAMUSCULAR | Status: DC | PRN

## 2022-10-13 MED ORDER — LEVOFLOXACIN IN D5W 500 MG/100ML IV SOLN
500.0000 mg | INTRAVENOUS | Status: AC
  Administered 2022-10-13: 500 mg via INTRAVENOUS
  Filled 2022-10-13: qty 100

## 2022-10-13 MED ORDER — HYDROCODONE-ACETAMINOPHEN 5-325 MG PO TABS
1.0000 | ORAL_TABLET | Freq: Four times a day (QID) | ORAL | 0 refills | Status: DC | PRN
Start: 2022-10-13 — End: 2023-01-12

## 2022-10-13 MED ORDER — MEPERIDINE HCL 50 MG/ML IJ SOLN: 6.2500 mg | INTRAMUSCULAR | Status: DC | PRN

## 2022-10-13 MED ORDER — LACTATED RINGERS IV SOLN: INTRAVENOUS | Status: DC

## 2022-10-13 MED ORDER — ORAL CARE MOUTH RINSE: 15.0000 mL | Freq: Once | OROMUCOSAL | Status: AC

## 2022-10-13 MED ORDER — FENTANYL CITRATE PF 50 MCG/ML IJ SOSY: 25.0000 ug | PREFILLED_SYRINGE | INTRAMUSCULAR | Status: DC | PRN

## 2022-10-13 SURGICAL SUPPLY — 20 items
BAG DRN RND TRDRP ANRFLXCHMBR (UROLOGICAL SUPPLIES) ×1
BAG URINE DRAIN 2000ML AR STRL (UROLOGICAL SUPPLIES) ×1 IMPLANT
BALLN NEPHROSTOMY (BALLOONS) ×1
BALLOON NEPHROSTOMY (BALLOONS) IMPLANT
CATH FOLEY 2W COUNCIL 20FR 5CC (CATHETERS) IMPLANT
CATH URET 5FR 28IN CONE TIP (BALLOONS)
CATH URET 5FR 70CM CONE TIP (BALLOONS) IMPLANT
CATH URETL OPEN END 6FR 70 (CATHETERS) IMPLANT
CLOTH BEACON ORANGE TIMEOUT ST (SAFETY) ×1 IMPLANT
GLOVE BIO SURGEON STRL SZ7.5 (GLOVE) ×1 IMPLANT
GOWN STRL REUS W/ TWL XL LVL3 (GOWN DISPOSABLE) ×1 IMPLANT
GOWN STRL REUS W/TWL XL LVL3 (GOWN DISPOSABLE) ×1
GUIDEWIRE ANG ZIPWIRE 038X150 (WIRE) IMPLANT
GUIDEWIRE STR DUAL SENSOR (WIRE) IMPLANT
KIT TURNOVER KIT A (KITS) IMPLANT
MANIFOLD NEPTUNE II (INSTRUMENTS) IMPLANT
NS IRRIG 1000ML POUR BTL (IV SOLUTION) IMPLANT
PACK CYSTO (CUSTOM PROCEDURE TRAY) ×1 IMPLANT
PENCIL SMOKE EVACUATOR (MISCELLANEOUS) IMPLANT
TUBING CONNECTING 10 (TUBING) ×1 IMPLANT

## 2022-10-13 NOTE — Anesthesia Postprocedure Evaluation (Signed)
Anesthesia Post Note  Patient: Louis Campos  Procedure(s) Performed: BALLOON DILATION OF URETHRAL STRICTURE (Bilateral) CYSTOSCOPY WITH DIRECT VISUAL INTERNAL URETHROTOMY, URETHRAL DILATATION (Bilateral)     Patient location during evaluation: PACU Anesthesia Type: General Level of consciousness: awake and alert, patient cooperative and oriented Pain management: pain level controlled Vital Signs Assessment: post-procedure vital signs reviewed and stable Respiratory status: spontaneous breathing, nonlabored ventilation and respiratory function stable Cardiovascular status: blood pressure returned to baseline and stable Postop Assessment: no apparent nausea or vomiting Anesthetic complications: no   No notable events documented.  Last Vitals:  Vitals:   10/13/22 1210  BP: (!) 167/84  Pulse: (!) 54  Resp: 16  Temp: 36.7 C  SpO2: 98%    Last Pain:  Vitals:   10/13/22 1210  TempSrc: Oral                 Hoang Reich,E. Grecia Lynk

## 2022-10-13 NOTE — Interval H&P Note (Signed)
History and Physical Interval Note:  10/13/2022 1:00 PM  Louis Campos  has presented today for surgery, with the diagnosis of urethral stricture.  The various methods of treatment have been discussed with the patient and family. After consideration of risks, benefits and other options for treatment, the patient has consented to  Procedure(s): BALLOON DILATION (Bilateral) CYSTOSCOPY WITH URETHRAL DILATATION (Bilateral) Direct visual internal urethrotomy as a surgical intervention.  The patient's history has been reviewed, patient examined, no change in status, stable for surgery.  I have reviewed the patient's chart and labs.  Questions were answered to the patient's satisfaction.     Di Kindle

## 2022-10-13 NOTE — Transfer of Care (Signed)
Immediate Anesthesia Transfer of Care Note  Patient: Louis Campos  Procedure(s) Performed: BALLOON DILATION OF URETHRAL STRICTURE (Bilateral) CYSTOSCOPY WITH DIRECT VISUAL INTERNAL URETHROTOMY, URETHRAL DILATATION (Bilateral)  Patient Location: PACU  Anesthesia Type:General  Level of Consciousness: awake, alert , oriented, and patient cooperative  Airway & Oxygen Therapy: Patient Spontanous Breathing and Patient connected to face mask oxygen  Post-op Assessment: Report given to RN and Post -op Vital signs reviewed and stable  Post vital signs: Reviewed and stable  Last Vitals:  Vitals Value Taken Time  BP 123/81 10/13/22 1435  Temp    Pulse 64 10/13/22 1438  Resp 9 10/13/22 1438  SpO2 100 % 10/13/22 1438  Vitals shown include unvalidated device data.  Last Pain:  Vitals:   10/13/22 1210  TempSrc: Oral         Complications: No notable events documented.

## 2022-10-13 NOTE — Anesthesia Procedure Notes (Signed)
Procedure Name: LMA Insertion Date/Time: 10/13/2022 1:40 PM  Performed by: Ponciano Ort, CRNAPre-anesthesia Checklist: Patient identified, Emergency Drugs available, Suction available and Patient being monitored Patient Re-evaluated:Patient Re-evaluated prior to induction Oxygen Delivery Method: Circle system utilized Preoxygenation: Pre-oxygenation with 100% oxygen Induction Type: IV induction LMA: LMA inserted LMA Size: 4.0 Number of attempts: 1 Airway Equipment and Method: Oral airway Placement Confirmation: positive ETCO2 and breath sounds checked- equal and bilateral Tube secured with: Tape Dental Injury: Teeth and Oropharynx as per pre-operative assessment

## 2022-10-13 NOTE — Anesthesia Preprocedure Evaluation (Addendum)
Anesthesia Evaluation  Patient identified by MRN, date of birth, ID band Patient awake    Reviewed: Allergy & Precautions, NPO status , Patient's Chart, lab work & pertinent test results  History of Anesthesia Complications Negative for: history of anesthetic complications  Airway Mallampati: II  TM Distance: >3 FB Neck ROM: Full    Dental  (+) Chipped, Dental Advisory Given   Pulmonary former smoker   breath sounds clear to auscultation       Cardiovascular (-) angina  Rhythm:Regular Rate:Normal     Neuro/Psych negative neurological ROS     GI/Hepatic Neg liver ROS,GERD  Medicated and Controlled,,  Endo/Other  negative endocrine ROS    Renal/GU stones   Urethral stricture    Musculoskeletal   Abdominal   Peds  Hematology negative hematology ROS (+)   Anesthesia Other Findings   Reproductive/Obstetrics                             Anesthesia Physical Anesthesia Plan  ASA: 2  Anesthesia Plan: General   Post-op Pain Management: Tylenol PO (pre-op)*   Induction: Intravenous  PONV Risk Score and Plan: 2 and Ondansetron and Dexamethasone  Airway Management Planned: LMA  Additional Equipment: None  Intra-op Plan:   Post-operative Plan:   Informed Consent: I have reviewed the patients History and Physical, chart, labs and discussed the procedure including the risks, benefits and alternatives for the proposed anesthesia with the patient or authorized representative who has indicated his/her understanding and acceptance.     Dental advisory given  Plan Discussed with: CRNA and Surgeon  Anesthesia Plan Comments:         Anesthesia Quick Evaluation

## 2022-10-13 NOTE — Op Note (Signed)
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  Postoperative diagnosis:  Bulbar urethral stricture History of UTI BPH with obstruction  Procedure:  Cystoscopy Direct visual internal urethrotomy Balloon dilation of urethral stricture (62F)  Attending: Milderd Meager, MD  Anesthesia: General  Estimated blood loss: 5 ml  Fluids: Per anesthesia record  Drains: 47F council tip catheter  Specimens: None  Antibiotics: Levaquin 500 mg IV  Findings: Severe bulbar urethral stricture, approximately 35F in diameter; lateral lobe enlargement of the prostate with a median lobe; trabeculations within the bladder  Indications:  72 year old male with a history of lower urinary tract symptoms and bulbar urethral stricture as well as BPH presents for surgical management of a bulbar urethral stricture.  He was evaluated for UTIs in February 2023.  Cystoscopy at that time demonstrated a narrow bulbar urethral stricture which would not allow passage of the cystoscope.  He underwent balloon dilation of the urethral stricture with a Optilume balloon in May 2023.  He did not notice a significant improvement in his lower urinary tract symptoms following dilation.  He continued to have postvoid residuals in the 250-300 ml  range.  He was evaluated with a uroflow study and February 2024 which showed a peak flow 4 mL/s.  Cystoscopy showed a short segment moderate to severe bulbar urethral stricture which would not allow passage of the scope.  He presents now for surgical management of the bulbar urethral stricture with cystoscopy, direct visual internal ureterotomy, and balloon dilation of urethral stricture.  Risk and benefits of the procedure were discussed with the patient in detail.  He understands the potential risk of stricture recurrence and the need for  additional procedures including urethroplasty.  Description of Procedure:  The patient received IV Levaquin preoperatively.  After successful induction of a general anesthetic, the patient was placed in the dorsolithotomy position.  The patient's genitalia was prepped and draped in sterile fashion.  Under direct visualization, a 21 French cystoscope was passed into the urethra.  The anterior urethra was normal in appearance.  A narrow stricture was noted in the bulbar urethra.  This appeared to be approximately 8 [place] in diameter.  I passed a sensor guidewire through this area and curled the wire in the bladder under fluoroscopy.  I then passed a 5 [place] open-ended catheter over the guidewire and into the bladder.  I confirmed position within the bladder with injection of contrast.  The guidewire was then replaced.  Using the visual urethrotome, direct visual internal urethrotomy was performed.  The stricture was incised at the 11:00 and 1 o'clock position.  This opened up the strictured area quite nicely.  I was able to pass the visual urethrotome through the area and into the bladder.  Using the 21 French cystoscope, I then reinspected the urethra which was opened nicely.  The prostatic urethra demonstrated by lobar enlargement of the prostate with a median lobe.  Inspection of the bladder demonstrated trabeculations.  No mucosal lesions were seen.  A normal-appearing trigone was noted.  The cystoscope was removed.  A 20 French council tip catheter was then passed over the guidewire and into the bladder.  10 mL of sterile water was placed in the balloon.  The patient was then extubated and taken to the post anesthesia care unit in stable condition.  Complications: None  Condition: Stable, extubated, transferred to PACU  Plan:  D/C home with foley Return to office on 5/24 for voiding trial Continue daily Macrobid

## 2022-10-14 ENCOUNTER — Encounter (HOSPITAL_COMMUNITY): Payer: Self-pay | Admitting: Urology

## 2022-10-16 ENCOUNTER — Ambulatory Visit: Payer: Medicare HMO | Admitting: Urology

## 2022-10-16 ENCOUNTER — Encounter: Payer: Self-pay | Admitting: Urology

## 2022-10-16 VITALS — BP 162/83 | HR 72 | Ht 67.0 in | Wt 156.0 lb

## 2022-10-16 DIAGNOSIS — R972 Elevated prostate specific antigen [PSA]: Secondary | ICD-10-CM | POA: Diagnosis not present

## 2022-10-16 DIAGNOSIS — N35912 Unspecified bulbous urethral stricture, male: Secondary | ICD-10-CM

## 2022-10-16 DIAGNOSIS — N138 Other obstructive and reflux uropathy: Secondary | ICD-10-CM

## 2022-10-16 DIAGNOSIS — N401 Enlarged prostate with lower urinary tract symptoms: Secondary | ICD-10-CM | POA: Diagnosis not present

## 2022-10-16 DIAGNOSIS — Z87440 Personal history of urinary (tract) infections: Secondary | ICD-10-CM | POA: Diagnosis not present

## 2022-10-16 NOTE — Progress Notes (Signed)
Assessment: 1. 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 BPH with obstruction/lower urinary tract symptoms   3. History of UTI   4. Elevated PSA     Plan: Continue daily Macrobid for UTI prevention. Continue silodosin 8 mg daily. Return to office in 2 weeks with bladder scan. Discussed CIC to prevent recurrence of stricture Discussed further evaluation of elevated PSA with TRUS/BX  Chief Complaint:  Chief Complaint  Patient presents with   Urethral Stricture    History of Present Illness:  Louis Campos is a 72 y.o. year old male who is seen for further evaluation of lower urinary tract symptoms.  He has a history of UTIs approximately once a year for several years beginning in 2014.  These resolved spontaneously.  About 1 year ago,  he noted foul-smelling and cloudy urine.  He was tested for UTI and by report his culture was negative.  He continued to have foul-smelling and cloudy urine.  He also reported urinary urgency at times, slight discomfort with voiding, weak stream, and nocturia 1-2 times. No gross hematuria or flank pain. IPSS = 13.  PVR = 350 mL. CT renal stone study from 07/11/2021 showed a small nonobstructing right renal stone, small calculus in the bladder, no evidence of renal mass or obstruction. Urine culture from 2/23 grew 25-50K staph haemolyticus. Treated with macrobid.  At his return visit in March 2023, he continued on alfuzosin.  He noted an improvement in the color and odor of his urine.  He continued to have a decreased stream, frequency, and sensation of incomplete emptying.  No dysuria or gross hematuria. IPSS = 15.  PVR = 330 ml. He was changed to silodosin 8 mg daily.  He continued on silodosin without significant change in his urinary symptoms.  He continued to have a weak stream, sensation of incomplete emptying, frequency and urgency. IPSS = 17. Cystoscopy demonstrated a narrow bulbar urethral stricture which would not  allow passage of the cystoscope.  He underwent balloon dilation of the urethral stricture on 10/08/2021.  A OptiLume balloon was used.  He was discharged with a catheter in place. He presented for a voiding trial on 10/13/2021.  He noted gross hematuria beginning 10/12/2021.[name]   His catheter was removed but he was unable to void and returned later that afternoon in retention.  A Foley catheter was placed with return of 750 mL of dark bloody urine.  The catheter was irrigated with return of multiple small clots. His catheter was removed on 10/16/2021 after a successful voiding trial.  At his visit in 6/23, he was voiding spontaneously after foley removal. No gross hematuria.  No dysuria.  He continued with some frequency and urgency.  No significant change in his stream. PVR = 279 ml.  At his visit in 7/23, he continued to report frequency, decreased force of stream, and sensation of incomplete emptying.  His symptoms were primarily in the morning.  He had nocturia 0-1 time per night.  No dysuria or gross hematuria.  He continues on silodosin. IPSS = 14. PVR = 267 ml  He was seen by Dr. Annabell Howells in January 2024 following a UTI.  He had a flow rate and PVR in February 2024 which showed a peak flow of 4 mL/s.  Cystoscopy in April 2024 showed a normal anterior urethra but a short segment moderate to severe bulbar stricture that would not allow passage of the scope. Pelvic ultrasound from 09/08/2022 showed a postvoid volume  of 371 mL.  No stones were seen within the bladder. PSA from 08/27/2022 was 4.5 with free PSA of 19.1%.   He continued to have lower urinary tract symptoms including weak stream, sensation of incomplete emptying, and urinary frequency.  No dysuria or gross hematuria.  No UTI symptoms.   IPSS = 17.  He underwent cystoscopy, DVIU, and balloon dilation of a bulbar urethral stricture on 10/14/2022.  He was discharged with a Foley catheter in place.  He presents today for a voiding trial.  His  foley has been draining well.  No gross hematuria.  Portions of the above documentation were copied from a prior visit for review purposes only.  Past Medical History:  Past Medical History:  Diagnosis Date   Acid reflux    patient denies   Pneumonia     Past Surgical History:  Past Surgical History:  Procedure Laterality Date   BALLOON DILATION Bilateral 10/13/2022   Procedure: BALLOON DILATION OF URETHRAL STRICTURE;  Surgeon: Milderd Meager., MD;  Location: [facility];  Service: Urology;  Laterality: Bilateral;   BIOPSY  09/09/2021   Procedure: BIOPSY;  Surgeon: Lanelle Bal, DO;  Location: [facility];  Service: Endoscopy;;   CYSTOSCOPY WITH URETHRAL DILATATION N/A 10/08/2021   Procedure: CYSTOSCOPY WITH URETHRAL DILATATION- balloon dilatation with OPTILUME;  Surgeon: Milderd Meager., MD;  Location: [facility];  Service: Urology;  Laterality: N/A;   CYSTOSCOPY WITH URETHRAL DILATATION Bilateral 10/13/2022   Procedure: CYSTOSCOPY WITH DIRECT VISUAL INTERNAL URETHROTOMY, URETHRAL DILATATION;  Surgeon: Milderd Meager., MD;  Location: [facility];  Service: Urology;  Laterality: Bilateral;   ESOPHAGEAL BRUSHING  09/09/2021   Procedure: ESOPHAGEAL BRUSHING;  Surgeon: Lanelle Bal, DO;  Location: [facility];  Service: Endoscopy;;   ESOPHAGOGASTRODUODENOSCOPY (EGD) WITH PROPOFOL N/A 09/09/2021   Procedure: ESOPHAGOGASTRODUODENOSCOPY (EGD) WITH PROPOFOL;  Surgeon: Lanelle Bal, DO;  Location: [facility];  Service: Endoscopy;  Laterality: N/A;  2:15pm   HAND SURGERY     right    Allergies:  No Known Allergies  Family History:  Family History  Problem Relation Age of Onset   Colon cancer Neg Hx    Esophageal cancer Neg Hx     Social History:  Social History   Tobacco Use   Smoking status: Former    Packs/day: 1.50    Years: 20.00    Additional pack years: 0.00    Total pack years: 30.00    Types: Cigarettes    Quit date: 2000    Years since quitting:  24.4    Passive exposure: Past   Smokeless tobacco: Never  Vaping Use   Vaping Use: Never used  Substance Use Topics   Alcohol use: Yes    Comment: couple of shots every week.   Drug use: Not Currently    ROS: Constitutional:  Negative for fever, chills, weight loss CV: Negative for chest pain, previous MI, hypertension Respiratory:  Negative for shortness of breath, wheezing, sleep apnea, frequent cough GI:  Negative for nausea, vomiting, bloody stool, GERD  Physical exam: BP (!) 162/83   Pulse 72   Ht 5\' 7"  (1.702 m)   Wt 156 lb (70.8 kg)   BMI 24.43 kg/m  GENERAL APPEARANCE:  Well appearing, well developed, well nourished, NAD HEENT:  Atraumatic, normocephalic, oropharynx clear NECK:  Supple without lymphadenopathy or thyromegaly ABDOMEN:  Soft, non-tender, no masses EXTREMITIES:  Moves all extremities well, without clubbing, cyanosis, or edema NEUROLOGIC:  Alert and oriented  x 3, normal gait, CN II-XII grossly intact MENTAL STATUS:  appropriate BACK:  Non-tender to palpation, No CVAT SKIN:  Warm, dry, and intact  Results: None  Procedure:  VOIDING TRIAL  A voiding trial was performed in the office today.   Volume of sterile water instilled: 200 mL Foley catheter removed intact. Volume voided by patient: 70 mL Instructed to return to office if has not voided by 4 PM

## 2022-10-16 NOTE — Progress Notes (Signed)
Fill and Pull Catheter Removal  Patient is present today for a catheter removal.  Patient was cleaned and prepped in a sterile fashion of sterile water/ saline was instilled into the bladder when the patient felt the urge to urinate, 9mL of water was then drained from the balloon.  A 20FR foley cath was removed from the bladder no complications were noted .  Patient as then given some time to void on their own.  Patient can void  70mL on their own after some time.  Patient tolerated well.  Performed by: Jimmye Norman. CMA

## 2022-11-02 ENCOUNTER — Ambulatory Visit: Payer: Medicare HMO | Admitting: Urology

## 2022-11-02 ENCOUNTER — Encounter: Payer: Self-pay | Admitting: Urology

## 2022-11-02 VITALS — BP 150/76 | HR 67 | Ht 67.0 in | Wt 156.0 lb

## 2022-11-02 DIAGNOSIS — N138 Other obstructive and reflux uropathy: Secondary | ICD-10-CM | POA: Diagnosis not present

## 2022-11-02 DIAGNOSIS — N35912 Unspecified bulbous urethral stricture, male: Secondary | ICD-10-CM

## 2022-11-02 DIAGNOSIS — Z87440 Personal history of urinary (tract) infections: Secondary | ICD-10-CM

## 2022-11-02 DIAGNOSIS — R972 Elevated prostate specific antigen [PSA]: Secondary | ICD-10-CM | POA: Diagnosis not present

## 2022-11-02 DIAGNOSIS — R339 Retention of urine, unspecified: Secondary | ICD-10-CM

## 2022-11-02 DIAGNOSIS — N401 Enlarged prostate with lower urinary tract symptoms: Secondary | ICD-10-CM | POA: Diagnosis not present

## 2022-11-02 LAB — BLADDER SCAN AMB NON-IMAGING

## 2022-11-02 MED ORDER — SILODOSIN 8 MG PO CAPS: 8.0000 mg | ORAL_CAPSULE | Freq: Every day | ORAL | 3 refills | Status: DC

## 2022-11-02 NOTE — Progress Notes (Signed)
Assessment: 1. 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 BPH with obstruction/lower urinary tract symptoms   3. History of UTI   4. Elevated PSA     Plan: Continue daily Macrobid for UTI prevention. Continue silodosin 8 mg daily. Patient instructed on CIC 2-3 times/week for his urethral stricture.  He did not wish to perform the procedure in the office today.  Supplies provided. Discussed further evaluation of elevated PSA with TRUS/BX ExoDX sent today - will call with results  Chief Complaint:  Chief Complaint  Patient presents with   Urethral Stricture    History of Present Illness:  Louis Campos is a 72 y.o. year old male who is seen for further evaluation of lower urinary tract symptoms.  He has a history of UTIs approximately once a year for several years beginning in 2014.  These resolved spontaneously.  About 1 year ago,  he noted foul-smelling and cloudy urine.  He was tested for UTI and by report his culture was negative.  He continued to have foul-smelling and cloudy urine.  He also reported urinary urgency at times, slight discomfort with voiding, weak stream, and nocturia 1-2 times. No gross hematuria or flank pain. IPSS = 13.  PVR = 350 mL. CT renal stone study from 07/11/2021 showed a small nonobstructing right renal stone, small calculus in the bladder, no evidence of renal mass or obstruction. Urine culture from 2/23 grew 25-50K staph haemolyticus. Treated with macrobid.  At his return visit in March 2023, he continued on alfuzosin.  He noted an improvement in the color and odor of his urine.  He continued to have a decreased stream, frequency, and sensation of incomplete emptying.  No dysuria or gross hematuria. IPSS = 15.  PVR = 330 ml. He was changed to silodosin 8 mg daily.  He continued on silodosin without significant change in his urinary symptoms.  He continued to have a weak stream, sensation of incomplete emptying, frequency and  urgency. IPSS = 17. Cystoscopy demonstrated a narrow bulbar urethral stricture which would not allow passage of the cystoscope.  He underwent balloon dilation of the urethral stricture on 10/08/2021.  A OptiLume balloon was used.  He was discharged with a catheter in place. He presented for a voiding trial on 10/13/2021.  He noted gross hematuria beginning 10/12/2021.[name]   His catheter was removed but he was unable to void and returned later that afternoon in retention.  A Foley catheter was placed with return of 750 mL of dark bloody urine.  The catheter was irrigated with return of multiple small clots. His catheter was removed on 10/16/2021 after a successful voiding trial.  At his visit in 6/23, he was voiding spontaneously after foley removal. No gross hematuria.  No dysuria.  He continued with some frequency and urgency.  No significant change in his stream. PVR = 279 ml.  At his visit in 7/23, he continued to report frequency, decreased force of stream, and sensation of incomplete emptying.  His symptoms were primarily in the morning.  He had nocturia 0-1 time per night.  No dysuria or gross hematuria.  He continues on silodosin. IPSS = 14. PVR = 267 ml  He was seen by Dr. Annabell Howells in January 2024 following a UTI.  He had a flow rate and PVR in February 2024 which showed a peak flow of 4 mL/s.  Cystoscopy in April 2024 showed a normal anterior urethra but a short segment moderate to severe  bulbar stricture that would not allow passage of the scope. Pelvic ultrasound from 09/08/2022 showed a postvoid volume of 371 mL.  No stones were seen within the bladder. PSA from 08/27/2022 was 4.5 with free PSA of 19.1%.   He continued to have lower urinary tract symptoms including weak stream, sensation of incomplete emptying, and urinary frequency.  No dysuria or gross hematuria.  No UTI symptoms.   IPSS = 17.  He underwent cystoscopy, DVIU, and balloon dilation of a bulbar urethral stricture on 10/14/2022.  He  was discharged with a Foley catheter in place.   His Foley was removed on 10/15/2020 after a successful voiding trial.  He returns today for follow-up.  He continues on daily Macrobid and Rapaflo. He can use to have lower urinary tract symptoms with decreased force of stream, sensation of incomplete emptying, and frequency.  He is not having any dysuria or gross hematuria.  Portions of the above documentation were copied from a prior visit for review purposes only.  Past Medical History:  Past Medical History:  Diagnosis Date   Acid reflux    patient denies   Pneumonia     Past Surgical History:  Past Surgical History:  Procedure Laterality Date   BALLOON DILATION Bilateral 10/13/2022   Procedure: BALLOON DILATION OF URETHRAL STRICTURE;  Surgeon: Milderd Meager., MD;  Location: [facility];  Service: Urology;  Laterality: Bilateral;   BIOPSY  09/09/2021   Procedure: BIOPSY;  Surgeon: Lanelle Bal, DO;  Location: [facility];  Service: Endoscopy;;   CYSTOSCOPY WITH URETHRAL DILATATION N/A 10/08/2021   Procedure: CYSTOSCOPY WITH URETHRAL DILATATION- balloon dilatation with OPTILUME;  Surgeon: Milderd Meager., MD;  Location: [facility];  Service: Urology;  Laterality: N/A;   CYSTOSCOPY WITH URETHRAL DILATATION Bilateral 10/13/2022   Procedure: CYSTOSCOPY WITH DIRECT VISUAL INTERNAL URETHROTOMY, URETHRAL DILATATION;  Surgeon: Milderd Meager., MD;  Location: [facility];  Service: Urology;  Laterality: Bilateral;   ESOPHAGEAL BRUSHING  09/09/2021   Procedure: ESOPHAGEAL BRUSHING;  Surgeon: Lanelle Bal, DO;  Location: [facility];  Service: Endoscopy;;   ESOPHAGOGASTRODUODENOSCOPY (EGD) WITH PROPOFOL N/A 09/09/2021   Procedure: ESOPHAGOGASTRODUODENOSCOPY (EGD) WITH PROPOFOL;  Surgeon: Lanelle Bal, DO;  Location: [facility];  Service: Endoscopy;  Laterality: N/A;  2:15pm   HAND SURGERY     right    Allergies:  No Known Allergies  Family History:  Family History   Problem Relation Age of Onset   Colon cancer Neg Hx    Esophageal cancer Neg Hx     Social History:  Social History   Tobacco Use   Smoking status: Former    Packs/day: 1.50    Years: 20.00    Additional pack years: 0.00    Total pack years: 30.00    Types: Cigarettes    Quit date: 2000    Years since quitting: 24.4    Passive exposure: Past   Smokeless tobacco: Never  Vaping Use   Vaping Use: Never used  Substance Use Topics   Alcohol use: Yes    Comment: couple of shots every week.   Drug use: Not Currently    ROS: Constitutional:  Negative for fever, chills, weight loss CV: Negative for chest pain, previous MI, hypertension Respiratory:  Negative for shortness of breath, wheezing, sleep apnea, frequent cough GI:  Negative for nausea, vomiting, bloody stool, GERD  Physical exam: BP (!) 150/76   Pulse 67   Ht 5\' 7"  (1.702 m)  Wt 156 lb (70.8 kg)   BMI 24.43 kg/m  GENERAL APPEARANCE:  Well appearing, well developed, well nourished, NAD HEENT:  Atraumatic, normocephalic, oropharynx clear NECK:  Supple without lymphadenopathy or thyromegaly ABDOMEN:  Soft, non-tender, no masses EXTREMITIES:  Moves all extremities well, without clubbing, cyanosis, or edema NEUROLOGIC:  Alert and oriented x 3, normal gait, CN II-XII grossly intact MENTAL STATUS:  appropriate BACK:  Non-tender to palpation, No CVAT SKIN:  Warm, dry, and intact  Results: U/A: 0-5 WBC, 0 RBC, few bacteria  PVR: 326 ml

## 2022-11-05 ENCOUNTER — Encounter: Payer: Self-pay | Admitting: Urology

## 2022-11-12 ENCOUNTER — Other Ambulatory Visit: Payer: Self-pay | Admitting: Urology

## 2022-11-12 ENCOUNTER — Telehealth: Payer: Self-pay | Admitting: Urology

## 2022-11-12 DIAGNOSIS — R972 Elevated prostate specific antigen [PSA]: Secondary | ICD-10-CM

## 2022-11-12 NOTE — Telephone Encounter (Signed)
Patient would like someone to go over lab results with him.  Call after 1:30pm if possible. Will not be home until then.

## 2022-11-16 LAB — URINALYSIS, ROUTINE W REFLEX MICROSCOPIC
Bilirubin, UA: NEGATIVE
Glucose, UA: NEGATIVE
Ketones, UA: NEGATIVE
Nitrite, UA: NEGATIVE
Protein,UA: NEGATIVE
RBC, UA: NEGATIVE
Specific Gravity, UA: 1.025 (ref 1.005–1.030)
Urobilinogen, Ur: 0.2 mg/dL (ref 0.2–1.0)
pH, UA: 5.5 (ref 5.0–7.5)

## 2022-11-16 LAB — MICROSCOPIC EXAMINATION: RBC, Urine: NONE SEEN /hpf (ref 0–2)

## 2022-12-03 ENCOUNTER — Ambulatory Visit: Payer: Medicare HMO | Admitting: Urology

## 2022-12-08 ENCOUNTER — Ambulatory Visit (HOSPITAL_BASED_OUTPATIENT_CLINIC_OR_DEPARTMENT_OTHER)
Admission: RE | Admit: 2022-12-08 | Discharge: 2022-12-08 | Disposition: A | Discharge status: Home / Self Care | Payer: Medicare HMO | Source: Ambulatory Visit | Attending: Urology | Admitting: Urology

## 2022-12-08 ENCOUNTER — Other Ambulatory Visit (HOSPITAL_BASED_OUTPATIENT_CLINIC_OR_DEPARTMENT_OTHER): Payer: Self-pay | Admitting: Urology

## 2022-12-08 ENCOUNTER — Encounter: Payer: Self-pay | Admitting: Urology

## 2022-12-08 ENCOUNTER — Ambulatory Visit (INDEPENDENT_AMBULATORY_CARE_PROVIDER_SITE_OTHER): Payer: Medicare HMO | Admitting: Urology

## 2022-12-08 VITALS — BP 144/80 | HR 71 | Ht 66.0 in | Wt 153.0 lb

## 2022-12-08 DIAGNOSIS — R972 Elevated prostate specific antigen [PSA]: Secondary | ICD-10-CM

## 2022-12-08 DIAGNOSIS — N401 Enlarged prostate with lower urinary tract symptoms: Secondary | ICD-10-CM

## 2022-12-08 DIAGNOSIS — Z87440 Personal history of urinary (tract) infections: Secondary | ICD-10-CM | POA: Insufficient documentation

## 2022-12-08 DIAGNOSIS — N4231 Prostatic intraepithelial neoplasia: Secondary | ICD-10-CM | POA: Diagnosis not present

## 2022-12-08 DIAGNOSIS — R339 Retention of urine, unspecified: Secondary | ICD-10-CM

## 2022-12-08 DIAGNOSIS — N138 Other obstructive and reflux uropathy: Secondary | ICD-10-CM | POA: Diagnosis not present

## 2022-12-08 DIAGNOSIS — Z87891 Personal history of nicotine dependence: Secondary | ICD-10-CM | POA: Diagnosis not present

## 2022-12-08 DIAGNOSIS — C61 Malignant neoplasm of prostate: Secondary | ICD-10-CM | POA: Diagnosis not present

## 2022-12-08 DIAGNOSIS — N35912 Unspecified bulbous urethral stricture, male: Secondary | ICD-10-CM | POA: Diagnosis not present

## 2022-12-08 DIAGNOSIS — Z2989 Encounter for other specified prophylactic measures: Secondary | ICD-10-CM | POA: Diagnosis not present

## 2022-12-08 LAB — URINALYSIS, ROUTINE W REFLEX MICROSCOPIC
Bilirubin, UA: NEGATIVE
Glucose, UA: NEGATIVE
Leukocytes,UA: NEGATIVE
Nitrite, UA: NEGATIVE
Protein,UA: NEGATIVE
Specific Gravity, UA: 1.03 (ref 1.005–1.030)
Urobilinogen, Ur: 0.2 mg/dL (ref 0.2–1.0)
pH, UA: 5.5 (ref 5.0–7.5)

## 2022-12-08 LAB — MICROSCOPIC EXAMINATION
Renal Epithel, UA: NONE SEEN /hpf
Trichomonas, UA: NONE SEEN
Yeast, UA: NONE SEEN

## 2022-12-08 MED ORDER — CEFTRIAXONE SODIUM 1 G IJ SOLR
1.0000 g | Freq: Once | INTRAMUSCULAR | Status: AC
Start: 2022-12-08 — End: 2022-12-08
  Administered 2022-12-08: 1 g via INTRAMUSCULAR

## 2022-12-08 NOTE — Progress Notes (Signed)
Assessment: 1. Elevated PSA   2. BPH with obstruction/lower urinary tract symptoms   3. 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 History of UTI   5. Incomplete bladder emptying     Plan: Post biopsy instructions given Return to office in 7-10 days for biopsy results Continue daily Macrobid for UTI prevention. Continue silodosin 8 mg daily.  Chief Complaint:  Chief Complaint  Patient presents with   Prostate Biopsy    History of Present Illness:  Louis Campos is a 72 y.o. year old male who is seen for further evaluation of elevated PSA.  He has a history of UTIs approximately once a year for several years beginning in 2014.  These resolved spontaneously.  About 1 year ago,  he noted foul-smelling and cloudy urine.  He was tested for UTI and by report his culture was negative.  He continued to have foul-smelling and cloudy urine.  He also reported urinary urgency at times, slight discomfort with voiding, weak stream, and nocturia 1-2 times. No gross hematuria or flank pain. IPSS = 13.  PVR = 350 mL. CT renal stone study from 07/11/2021 showed a small nonobstructing right renal stone, small calculus in the bladder, no evidence of renal mass or obstruction. Urine culture from 2/23 grew 25-50K staph haemolyticus. Treated with macrobid.  At his return visit in March 2023, he continued on alfuzosin.  He noted an improvement in the color and odor of his urine.  He continued to have a decreased stream, frequency, and sensation of incomplete emptying.  No dysuria or gross hematuria. IPSS = 15.  PVR = 330 ml. He was changed to silodosin 8 mg daily.  He continued on silodosin without significant change in his urinary symptoms.  He continued to have a weak stream, sensation of incomplete emptying, frequency and urgency. IPSS = 17. Cystoscopy demonstrated a narrow bulbar urethral stricture which would not allow passage of the cystoscope.  He underwent balloon  dilation of the urethral stricture on 10/08/2021.  A OptiLume balloon was used.  He was discharged with a catheter in place. He presented for a voiding trial on 10/13/2021.  He noted gross hematuria beginning 10/12/2021.[name]   His catheter was removed but he was unable to void and returned later that afternoon in retention.  A Foley catheter was placed with return of 750 mL of dark bloody urine.  The catheter was irrigated with return of multiple small clots. His catheter was removed on 10/16/2021 after a successful voiding trial.  At his visit in 6/23, he was voiding spontaneously after foley removal. No gross hematuria.  No dysuria.  He continued with some frequency and urgency.  No significant change in his stream. PVR = 279 ml.  At his visit in 7/23, he continued to report frequency, decreased force of stream, and sensation of incomplete emptying.  His symptoms were primarily in the morning.  He had nocturia 0-1 time per night.  No dysuria or gross hematuria.  He continues on silodosin. IPSS = 14. PVR = 267 ml  He was seen by Dr. Annabell Howells in January 2024 following a UTI.  He had a flow rate and PVR in February 2024 which showed a peak flow of 4 mL/s.  Cystoscopy in April 2024 showed a normal anterior urethra but a short segment moderate to severe bulbar stricture that would not allow passage of the scope. Pelvic ultrasound from 09/08/2022 showed a postvoid volume of 371 mL.  No stones were  seen within the bladder. PSA from 08/27/2022 was 4.5 with free PSA of 19.1%.   He continued to have lower urinary tract symptoms including weak stream, sensation of incomplete emptying, and urinary frequency.  No dysuria or gross hematuria.  No UTI symptoms.   IPSS = 17.  He underwent cystoscopy, DVIU, and balloon dilation of a bulbar urethral stricture on 10/14/2022.  He was discharged with a Foley catheter in place.   His Foley was removed on 10/16/2022 after a successful voiding trial.  At his visit in June 2024, he  continued on daily Macrobid and Rapaflo. He continued to have lower urinary tract symptoms with decreased force of stream, sensation of incomplete emptying, and frequency.  No dysuria or gross hematuria. PVR = 326 ml  ExoDX score elevated at 29.53. He presents today for prostate ultrasound and biopsy.  Portions of the above documentation were copied from a prior visit for review purposes only.  Past Medical History:  Past Medical History:  Diagnosis Date   Acid reflux    patient denies   Pneumonia     Past Surgical History:  Past Surgical History:  Procedure Laterality Date   BALLOON DILATION Bilateral 10/13/2022   Procedure: BALLOON DILATION OF URETHRAL STRICTURE;  Surgeon: Milderd Meager., MD;  Location: [facility];  Service: Urology;  Laterality: Bilateral;   BIOPSY  09/09/2021   Procedure: BIOPSY;  Surgeon: Lanelle Bal, DO;  Location: [facility];  Service: Endoscopy;;   CYSTOSCOPY WITH URETHRAL DILATATION N/A 10/08/2021   Procedure: CYSTOSCOPY WITH URETHRAL DILATATION- balloon dilatation with OPTILUME;  Surgeon: Milderd Meager., MD;  Location: [facility];  Service: Urology;  Laterality: N/A;   CYSTOSCOPY WITH URETHRAL DILATATION Bilateral 10/13/2022   Procedure: CYSTOSCOPY WITH DIRECT VISUAL INTERNAL URETHROTOMY, URETHRAL DILATATION;  Surgeon: Milderd Meager., MD;  Location: [facility];  Service: Urology;  Laterality: Bilateral;   ESOPHAGEAL BRUSHING  09/09/2021   Procedure: ESOPHAGEAL BRUSHING;  Surgeon: Lanelle Bal, DO;  [facility]: [facility];  Service: Endoscopy;;   ESOPHAGOGASTRODUODENOSCOPY (EGD) WITH PROPOFOL N/A 09/09/2021   Procedure: ESOPHAGOGASTRODUODENOSCOPY (EGD) WITH PROPOFOL;  Surgeon: Lanelle Bal, DO;  Location: [facility];  Service: Endoscopy;  Laterality: N/A;  2:15pm   HAND SURGERY     right    Allergies:  No Known Allergies  Family History:  Family History  Problem Relation Age of Onset   Colon cancer Neg Hx    Esophageal  cancer Neg Hx     Social History:  Social History   Tobacco Use   Smoking status: Former    Current packs/day: 0.00    Average packs/day: 1.5 packs/day for 20.0 years (30.0 ttl pk-yrs)    Types: Cigarettes    Start date: 30    Quit date: 2000    Years since quitting: 24.5    Passive exposure: Past   Smokeless tobacco: Never  Vaping Use   Vaping status: Never Used  Substance Use Topics   Alcohol use: Yes    Comment: couple of shots every week.   Drug use: Not Currently    ROS: Constitutional:  Negative for fever, chills, weight loss CV: Negative for chest pain, previous MI, hypertension Respiratory:  Negative for shortness of breath, wheezing, sleep apnea, frequent cough GI:  Negative for nausea, vomiting, bloody stool, GERD  Physical exam: BP (!) 144/80   Pulse 71   Ht 5\' 6"  (1.676 m)   Wt 153 lb (69.4 kg)   BMI 24.69 kg/m  GENERAL APPEARANCE:  Well appearing, well developed, well nourished, NAD HEENT:  Atraumatic, normocephalic, oropharynx clear NECK:  Supple without lymphadenopathy or thyromegaly ABDOMEN:  Soft, non-tender, no masses EXTREMITIES:  Moves all extremities well, without clubbing, cyanosis, or edema NEUROLOGIC:  Alert and oriented x 3, normal gait, CN II-XII grossly intact MENTAL STATUS:  appropriate BACK:  Non-tender to palpation, No CVAT SKIN:  Warm, dry, and intact  Results: U/A:  TRANSRECTAL ULTRASOUND AND PROSTATE BIOPSY  Indication:  Elevated PSA, abnormal ExoDx score  Prophylactic antibiotic administration: Rocephin  All medications that could result in increased bleeding were discontinued within an appropriate period of the time of biopsy.  Risk including bleeding and infection were discussed.  Informed consent was obtained.  The patient was placed in the left lateral decubitus position.  PROCEDURE 1.  TRANSRECTAL ULTRASOUND OF THE PROSTATE  The 7 MHz transrectal probe was used to image the prostate.  Anal stenosis was not  noted.  TRUS volume: 40 ml  Hypoechoic areas: None  Hyperechoic areas: None  Central calcifications: not present  Margins:  normal  Seminal Vesicles: normal   PROCEDURE 2:  PROSTATE BIOPSY  A periprostatic block was performed using 1% lidocaine and transrectal ultrasound guidance. Under transrectal ultrasound guidance, and using the Biopty gun, prostate biopsies were obtained systematically from the apex, mid gland, and base bilaterally.  A total of 12 cores were obtained.  Hemostasis was obtained with gentle pressure on the prostate.  The procedures were well-tolerated.  No significant bleeding was noted at the end of the procedure.  The patient was stable for discharge from the office.

## 2022-12-08 NOTE — Progress Notes (Signed)
IM Injection  Patient is present today for an IM Injection for treatment of infection prevention post prostate biopsy Drug: Ceftriaxone Dose:1g Location:Right upper outer buttocks Lot: [id number] Exp:11/2024 Patient tolerated well, no complications were noted  Performed by: Jimmye Norman., CMA

## 2022-12-14 ENCOUNTER — Encounter: Payer: Self-pay | Admitting: Urology

## 2022-12-16 ENCOUNTER — Encounter: Payer: Self-pay | Admitting: Urology

## 2022-12-16 ENCOUNTER — Ambulatory Visit: Payer: Medicare HMO | Admitting: Urology

## 2022-12-16 VITALS — BP 143/76 | HR 77 | Ht 66.0 in | Wt 153.0 lb

## 2022-12-16 DIAGNOSIS — R339 Retention of urine, unspecified: Secondary | ICD-10-CM | POA: Diagnosis not present

## 2022-12-16 DIAGNOSIS — C61 Malignant neoplasm of prostate: Secondary | ICD-10-CM

## 2022-12-16 DIAGNOSIS — N401 Enlarged prostate with lower urinary tract symptoms: Secondary | ICD-10-CM | POA: Diagnosis not present

## 2022-12-16 DIAGNOSIS — N138 Other obstructive and reflux uropathy: Secondary | ICD-10-CM | POA: Diagnosis not present

## 2022-12-16 DIAGNOSIS — N35912 Unspecified bulbous urethral stricture, male: Secondary | ICD-10-CM

## 2022-12-16 DIAGNOSIS — Z87440 Personal history of urinary (tract) infections: Secondary | ICD-10-CM | POA: Diagnosis not present

## 2022-12-16 LAB — URINALYSIS, ROUTINE W REFLEX MICROSCOPIC
Bilirubin, UA: NEGATIVE
Glucose, UA: NEGATIVE
Ketones, UA: NEGATIVE
Leukocytes,UA: NEGATIVE
Nitrite, UA: NEGATIVE
Protein,UA: NEGATIVE
Specific Gravity, UA: 1.025 (ref 1.005–1.030)
Urobilinogen, Ur: 0.2 mg/dL (ref 0.2–1.0)
pH, UA: 5.5 (ref 5.0–7.5)

## 2022-12-16 LAB — MICROSCOPIC EXAMINATION

## 2022-12-16 NOTE — Progress Notes (Signed)
Assessment: 1. Prostate cancer (HCC); PSA 4.5; GG 3; unfavorable intermediate risk   2. BPH with obstruction/lower urinary tract symptoms   3. 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 History of UTI   5. Incomplete bladder emptying     Plan: Continue daily Macrobid for UTI prevention. Continue silodosin 8 mg daily.  I spent a total of 45 minutes counseling Louis Campos regarding the diagnosis of localized prostate cancer.  I spent the first 15 minutes discussing the biopsy results.  Using the prostate cancer nomogram, I cited a probability of 61 percent for localized prostate cancer, 37 percent of extracapsular extension, 4 percent of seminal vesicle involvement, and 6 percent of lymph node involvement.  I discussed the diagnosis of localized prostate cancer in the natural history of prostate cancer. I then spent the next 30 minutes discussing treatment options for localized prostate cancer.  Specifically, I discussed radical prostatectomy (RALP, RRP, RPP), external beam radiation, low dose rate brachytherapy, cryosurgery, and high intensity frequency ultrasound.  I discussed the risk and benefits of each treatment.  I discussed the potential risk of impotence and incontinence as they relate to treatment of prostate cancer.  Questions were answered.  The patient was given literature regarding prostate cancer to review.  Given his unfavorable intermediate risk disease as well as his lower urinary tract symptoms with incomplete emptying, I recommended that he consider a radical prostatectomy as this would treat both his prostate cancer as well as improve his lower urinary tract symptoms which are likely secondary to his BPH.  I discussed treatment with radiation therapy.  I advised him that any procedures for management of his BPH with lower urinary tract symptoms would need to be done prior to radiation therapy.  Schedule for PSMA PET scan for staging of unfavorable  intermediate risk prostate cancer   Chief Complaint:  Chief Complaint  Patient presents with   Prostate Cancer    History of Present Illness:  Louis Campos is a 72 y.o. year old male who is seen for further evaluation of elevated PSA.  He has a history of UTIs approximately once a year for several years beginning in 2014.  These resolved spontaneously.  About 1 year ago,  he noted foul-smelling and cloudy urine.  He was tested for UTI and by report his culture was negative.  He continued to have foul-smelling and cloudy urine.  He also reported urinary urgency at times, slight discomfort with voiding, weak stream, and nocturia 1-2 times. No gross hematuria or flank pain. IPSS = 13.  PVR = 350 mL. CT renal stone study from 07/11/2021 showed a small nonobstructing right renal stone, small calculus in the bladder, no evidence of renal mass or obstruction. Urine culture from 2/23 grew 25-50K staph haemolyticus. Treated with macrobid.  At his return visit in March 2023, he continued on alfuzosin.  He noted an improvement in the color and odor of his urine.  He continued to have a decreased stream, frequency, and sensation of incomplete emptying.  No dysuria or gross hematuria. IPSS = 15.  PVR = 330 ml. He was changed to silodosin 8 mg daily.  He continued on silodosin without significant change in his urinary symptoms.  He continued to have a weak stream, sensation of incomplete emptying, frequency and urgency. IPSS = 17. Cystoscopy demonstrated a narrow bulbar urethral stricture which would not allow passage of the cystoscope.  He underwent balloon dilation of the urethral stricture on 10/08/2021.  A OptiLume balloon was used.  He was discharged with a catheter in place. He presented for a voiding trial on 10/13/2021.  He noted gross hematuria beginning 10/12/2021.[name]   His catheter was removed but he was unable to void and returned later that afternoon in retention.  A Foley catheter was placed  with return of 750 mL of dark bloody urine.  The catheter was irrigated with return of multiple small clots. His catheter was removed on 10/16/2021 after a successful voiding trial.  At his visit in 6/23, he was voiding spontaneously after foley removal. No gross hematuria.  No dysuria.  He continued with some frequency and urgency.  No significant change in his stream. PVR = 279 ml.  At his visit in 7/23, he continued to report frequency, decreased force of stream, and sensation of incomplete emptying.  His symptoms were primarily in the morning.  He had nocturia 0-1 time per night.  No dysuria or gross hematuria.  He continues on silodosin. IPSS = 14. PVR = 267 ml  He was seen by Dr. Annabell Howells in January 2024 following a UTI.  He had a flow rate and PVR in February 2024 which showed a peak flow of 4 mL/s.  Cystoscopy in April 2024 showed a normal anterior urethra but a short segment moderate to severe bulbar stricture that would not allow passage of the scope. Pelvic ultrasound from 09/08/2022 showed a postvoid volume of 371 mL.  No stones were seen within the bladder. PSA from 08/27/2022 was 4.5 with free PSA of 19.1%.   He continued to have lower urinary tract symptoms including weak stream, sensation of incomplete emptying, and urinary frequency.  No dysuria or gross hematuria.  No UTI symptoms.   IPSS = 17.  He underwent cystoscopy, DVIU, and balloon dilation of a bulbar urethral stricture on 10/14/2022.  He was discharged with a Foley catheter in place.   His Foley was removed on 10/16/2022 after a successful voiding trial.  At his visit in June 2024, he continued on daily Macrobid and Rapaflo. He continued to have lower urinary tract symptoms with decreased force of stream, sensation of incomplete emptying, and frequency.  No dysuria or gross hematuria. PVR = 326 ml  ExoDX score elevated at 29.53.  Louis Campos  returns today following his transrectal ultrasound and biopsy of the prostate on  12/08/22. PSA: 4.5 ng/ml TRUS volume:  40 ml  PSA density:  0.11 Biopsy results:             Gleason score: 4+ 3 = 7            # positive cores: 1/6 on right    0/6 on left            Location of cancer: mid gland  Complications after biopsy: none Patient with significant LUTS.    IPSS = 16 Patient without erectile dysfunction.     Portions of the above documentation were copied from a prior visit for review purposes only.  Past Medical History:  Past Medical History:  Diagnosis Date   Acid reflux    patient denies   Pneumonia     Past Surgical History:  Past Surgical History:  Procedure Laterality Date   BALLOON DILATION Bilateral 10/13/2022   Procedure: BALLOON DILATION OF URETHRAL STRICTURE;  Surgeon: Milderd Meager., MD;  Location: [facility];  Service: Urology;  Laterality: Bilateral;   BIOPSY  09/09/2021   Procedure: BIOPSY;  Surgeon: Lanelle Bal, DO;  Location: [facility];  Service: Endoscopy;;   CYSTOSCOPY WITH URETHRAL DILATATION N/A 10/08/2021   Procedure: CYSTOSCOPY WITH URETHRAL DILATATION- balloon dilatation with OPTILUME;  Surgeon: Milderd Meager., MD;  Location: [facility];  Service: Urology;  Laterality: N/A;   CYSTOSCOPY WITH URETHRAL DILATATION Bilateral 10/13/2022   Procedure: CYSTOSCOPY WITH DIRECT VISUAL INTERNAL URETHROTOMY, URETHRAL DILATATION;  Surgeon: Milderd Meager., MD;  Location: [facility];  Service: Urology;  Laterality: Bilateral;   ESOPHAGEAL BRUSHING  09/09/2021   Procedure: ESOPHAGEAL BRUSHING;  Surgeon: Lanelle Bal, DO;  Location: [facility];  Service: Endoscopy;;   ESOPHAGOGASTRODUODENOSCOPY (EGD) WITH PROPOFOL N/A 09/09/2021   Procedure: ESOPHAGOGASTRODUODENOSCOPY (EGD) WITH PROPOFOL;  Surgeon: Lanelle Bal, DO;  Location: [facility];  Service: Endoscopy;  Laterality: N/A;  2:15pm   HAND SURGERY     right    Allergies:  No Known Allergies  Family History:  Family History  Problem Relation Age of Onset    Colon cancer Neg Hx    Esophageal cancer Neg Hx     Social History:  Social History   Tobacco Use   Smoking status: Former    Current packs/day: 0.00    Average packs/day: 1.5 packs/day for 20.0 years (30.0 ttl pk-yrs)    Types: Cigarettes    Start date: 26    Quit date: 2000    Years since quitting: 24.5    Passive exposure: Past   Smokeless tobacco: Never  Vaping Use   Vaping status: Never Used  Substance Use Topics   Alcohol use: Yes    Comment: couple of shots every week.   Drug use: Not Currently    ROS: Constitutional:  Negative for fever, chills, weight loss CV: Negative for chest pain, previous MI, hypertension Respiratory:  Negative for shortness of breath, wheezing, sleep apnea, frequent cough GI:  Negative for nausea, vomiting, bloody stool, GERD  Physical exam: BP (!) 143/76   Pulse 77   Ht 5\' 6"  (1.676 m)   Wt 153 lb (69.4 kg)   BMI 24.69 kg/m  GENERAL APPEARANCE:  Well appearing, well developed, well nourished, NAD HEENT:  Atraumatic, normocephalic, oropharynx clear NECK:  Supple without lymphadenopathy or thyromegaly ABDOMEN:  Soft, non-tender, no masses EXTREMITIES:  Moves all extremities well, without clubbing, cyanosis, or edema NEUROLOGIC:  Alert and oriented x 3, normal gait, CN II-XII grossly intact MENTAL STATUS:  appropriate BACK:  Non-tender to palpation, No CVAT SKIN:  Warm, dry, and intact  Results: U/A: 0-5 WBC, 0-2 RBC, few bacteria

## 2022-12-24 ENCOUNTER — Encounter (HOSPITAL_COMMUNITY)
Admission: RE | Admit: 2022-12-24 | Discharge: 2022-12-24 | Disposition: A | Discharge status: Home / Self Care | Payer: Medicare HMO | Source: Ambulatory Visit | Attending: Urology | Admitting: Urology

## 2022-12-24 DIAGNOSIS — C61 Malignant neoplasm of prostate: Secondary | ICD-10-CM | POA: Diagnosis not present

## 2022-12-24 MED ORDER — PIFLIFOLASTAT F 18 (PYLARIFY) INJECTION
9.0000 | Freq: Once | INTRAVENOUS | Status: AC
  Administered 2022-12-24: 8.62 via INTRAVENOUS

## 2022-12-28 ENCOUNTER — Encounter: Payer: Self-pay | Admitting: Urology

## 2022-12-29 ENCOUNTER — Other Ambulatory Visit: Payer: Self-pay | Admitting: Urology

## 2022-12-29 ENCOUNTER — Telehealth: Payer: Self-pay | Admitting: Urology

## 2022-12-29 ENCOUNTER — Telehealth: Payer: Self-pay | Admitting: Radiation Oncology

## 2022-12-29 DIAGNOSIS — C61 Malignant neoplasm of prostate: Secondary | ICD-10-CM

## 2022-12-29 NOTE — Telephone Encounter (Signed)
8/6 @ 10:10 am Left message with patient's wife for patient to call our office to be schedule for consult.

## 2022-12-29 NOTE — Telephone Encounter (Signed)
Fax # [phone] Phone # [phone] ext [phone]  Needs pathology report faxed over. Unable to view in chart

## 2023-01-07 NOTE — Progress Notes (Signed)
GU Location of Tumor / Histology: Prostate Ca  If Prostate Cancer, Gleason Score is (4 + 3) and PSA is (4.5 on 12/16/2022)  Biopsies    12/24/2022 Dr. Di Kindle NM PET (PSMA) Skull to Mid Thigh CLINICAL DATA: Prostate carcinoma intermediate risk, staging.   IMPRESSION: 1. Subtle low-level nodular focus of radiotracer avidity along the right side of the prostate gland, likely reflects patient's known primary prostate neoplasm. 2. No evidence of radiotracer avid metastatic disease. 3.  Aortic Atherosclerosis (ICD10-I70.0).   Past/Anticipated interventions by urology, if any:   Dr. Di Kindle   Past/Anticipated interventions by medical oncology, if any: NA  Weight changes, if any: {:18581}  IPSS: SHIM:  Bowel/Bladder complaints, if any: {:18581}   Nausea/Vomiting, if any: {:18581}  Pain issues, if any:  {:18581}  SAFETY ISSUES: Prior radiation? {:18581} Pacemaker/ICD? {:18581} Possible current pregnancy? Male Is the patient on methotrexate? No  Current Complaints / other details:

## 2023-01-11 NOTE — Progress Notes (Signed)
Radiation Oncology         [phone] ________________________________  Initial Outpatient Consultation  Name: Louis Campos MRN: [id number]  Date: 01/12/2023  DOB: June 24, 1950  [id number], [name], MD  Milderd Meager., *   REFERRING PHYSICIAN: Milderd Meager., *  DIAGNOSIS: 72 y.o. gentleman with Stage T1c adenocarcinoma of the prostate with Gleason score of 4+3, and PSA of 4.5.    ICD-10-CM   1. Malignant neoplasm of prostate (HCC)  C61       HISTORY OF PRESENT ILLNESS: Louis Campos is a 72 y.o. male with a diagnosis of prostate cancer. He has been followed by Dr. Pete Glatter since 06/2021 for urethral stricture, s/p DVIU with balloon dilation in 09/2021 and 09/2022.  He has been performing clean intermittent catheterization 2-3 times per week since that time and is on Macrobid for UTI prophylaxis and taking Rapaflo daily for management of his BPH with BOO.  He was previously noted to have a borderline-elevated PSA of 4 in 08/2021. This was repeated one year later and showed a slight increase to 4.5. An ExoDx prostate test was performed and predicted a moderate risk of high-grade prostate cancer, with a score of 29.53 out of 100.  Therefore, the patient proceeded to transrectal ultrasound with 12 biopsies of the prostate on 12/08/22.  The prostate volume measured 40 cc.  Out of 12 core biopsies, only 1 was positive.  The maximum Gleason score was 4+3, and this was seen in the right lateral mid.  He underwent staging PSMA PET scan on 12/24/22 showing no evidence of disease outside of the prostate.  The patient reviewed the biopsy results with his urologist and he has kindly been referred today for discussion of potential radiation treatment options.   PREVIOUS RADIATION THERAPY: No  PAST MEDICAL HISTORY:  Past Medical History:  Diagnosis Date   Acid reflux    patient denies   Pneumonia       PAST SURGICAL HISTORY: Past Surgical History:  Procedure Laterality Date    BALLOON DILATION Bilateral 10/13/2022   Procedure: BALLOON DILATION OF URETHRAL STRICTURE;  Surgeon: Milderd Meager., MD;  Location: [facility];  Service: Urology;  Laterality: Bilateral;   BIOPSY  09/09/2021   Procedure: BIOPSY;  Surgeon: Lanelle Bal, DO;  Location: [facility];  Service: Endoscopy;;   CYSTOSCOPY WITH URETHRAL DILATATION N/A 10/08/2021   Procedure: CYSTOSCOPY WITH URETHRAL DILATATION- balloon dilatation with OPTILUME;  Surgeon: Milderd Meager., MD;  Location: [facility];  Service: Urology;  Laterality: N/A;   CYSTOSCOPY WITH URETHRAL DILATATION Bilateral 10/13/2022   Procedure: CYSTOSCOPY WITH DIRECT VISUAL INTERNAL URETHROTOMY, URETHRAL DILATATION;  Surgeon: Milderd Meager., MD;  Location: [facility];  Service: Urology;  Laterality: Bilateral;   ESOPHAGEAL BRUSHING  09/09/2021   Procedure: ESOPHAGEAL BRUSHING;  Surgeon: Lanelle Bal, DO;  Location: [facility];  Service: Endoscopy;;   ESOPHAGOGASTRODUODENOSCOPY (EGD) WITH PROPOFOL N/A 09/09/2021   Procedure: ESOPHAGOGASTRODUODENOSCOPY (EGD) WITH PROPOFOL;  Surgeon: Lanelle Bal, DO;  Location: [facility];  Service: Endoscopy;  Laterality: N/A;  2:15pm   HAND SURGERY     right   PROSTATE BIOPSY      FAMILY HISTORY:  Family History  Problem Relation Age of Onset   Colon cancer Neg Hx    Esophageal cancer Neg Hx     SOCIAL HISTORY:  Social History   Socioeconomic History   Marital status: Married    Spouse name: Not on file   Number of  children: Not on file   Years of education: Not on file   Highest education level: Not on file  Occupational History   Not on file  Tobacco Use   Smoking status: Former    Current packs/day: 0.00    Average packs/day: 1.5 packs/day for 20.0 years (30.0 ttl pk-yrs)    Types: Cigarettes    Start date: 43    Quit date: 2000    Years since quitting: 24.6    Passive exposure: Past   Smokeless tobacco: Never  Vaping Use   Vaping status: Never Used   Substance and Sexual Activity   Alcohol use: Yes    Alcohol/week: 1.0 standard drink of alcohol    Types: 1 Cans of beer per week    Comment: couple of shots every week.   Drug use: Not Currently   Sexual activity: Yes  Other Topics Concern   Not on file  Social History Narrative   Not on file   Social Determinants of Health   Financial Resource Strain: Not on file  Food Insecurity: No Food Insecurity (01/12/2023)   Hunger Vital Sign    Worried About Running Out of Food in the Last Year: Never true    Ran Out of Food in the Last Year: Never true  Transportation Needs: No Transportation Needs (01/12/2023)   PRAPARE - Administrator, Civil Service (Medical): No    Lack of Transportation (Non-Medical): No  Physical Activity: Not on file  Stress: Not on file  Social Connections: Not on file  Intimate Partner Violence: Not At Risk (01/12/2023)   Humiliation, Afraid, Rape, and Kick questionnaire    Fear of Current or Ex-Partner: No    Emotionally Abused: No    Physically Abused: No    Sexually Abused: No    ALLERGIES: Patient has no known allergies.  MEDICATIONS:  Current Outpatient Medications  Medication Sig Dispense Refill   acetaminophen (TYLENOL) 500 MG tablet Take 500 mg by mouth every 6 (six) hours as needed. Takes 2 tablets as needed for pain.     ibuprofen (ADVIL) 200 MG tablet Take 200-600 mg by mouth every 6 (six) hours as needed for headache or mild pain.     nitrofurantoin, macrocrystal-monohydrate, (MACROBID) 100 MG capsule Take 1 capsule (100 mg total) by mouth daily. 30 capsule 2   silodosin (RAPAFLO) 8 MG CAPS capsule Take 1 capsule (8 mg total) by mouth daily with breakfast. (Patient taking differently: Take 8 mg by mouth at bedtime.) 30 capsule 3   pantoprazole (PROTONIX) 40 MG tablet Take 1 tablet (40 mg total) by mouth 2 (two) times daily. (Patient taking differently: Take 40 mg by mouth daily.) 60 tablet 11   No current facility-administered  medications for this encounter.    REVIEW OF SYSTEMS:  On review of systems, the patient reports that he is doing well overall. He denies any chest pain, shortness of breath, cough, fevers, chills, night sweats, unintended weight changes. He denies any bowel disturbances, and denies abdominal pain, nausea or vomiting. He denies any new musculoskeletal or joint aches or pains. His IPSS was 13, indicating moderate urinary symptoms with a weak flow of stream and feelings of incomplete bladder emptying. His SHIM was 19, indicating he has mild erectile dysfunction. A complete review of systems is obtained and is otherwise negative.    PHYSICAL EXAM:  Wt Readings from Last 3 Encounters:  01/12/23 164 lb (74.4 kg)  12/16/22 153 lb (69.4 kg)  12/08/22 153  lb (69.4 kg)   Temp Readings from Last 3 Encounters:  01/12/23 98.9 F (37.2 C) (Oral)  10/13/22 97.6 F (36.4 C)  10/06/22 98.4 F (36.9 C) (Oral)   BP Readings from Last 3 Encounters:  01/12/23 (!) 152/86  12/16/22 (!) 143/76  12/08/22 (!) 144/80   Pulse Readings from Last 3 Encounters:  01/12/23 63  12/16/22 77  12/08/22 71   Pain Assessment Pain Score: 0-No pain/10  In general this is a well appearing Caucasian man in no acute distress. He's alert and oriented x4 and appropriate throughout the examination. Cardiopulmonary assessment is negative for acute distress, and he exhibits normal effort.     KPS = 100  100 - Normal; no complaints; no evidence of disease. 90   - Able to carry on normal activity; minor signs or symptoms of disease. 80   - Normal activity with effort; some signs or symptoms of disease. 69   - Cares for self; unable to carry on normal activity or to do active work. 60   - Requires occasional assistance, but is able to care for most of his personal needs. 50   - Requires considerable assistance and frequent medical care. 40   - Disabled; requires special care and assistance. 30   - Severely disabled;  hospital admission is indicated although death not imminent. 20   - Very sick; hospital admission necessary; active supportive treatment necessary. 10   - Moribund; fatal processes progressing rapidly. 0     - Dead  Karnofsky DA, Abelmann WH, Craver LS and Burchenal [facility] [phone]) The use of the nitrogen mustards in the palliative treatment of carcinoma: with particular reference to bronchogenic carcinoma Cancer 1 634-56  LABORATORY DATA:  Lab Results  Component Value Date   WBC 3.8 (L) 10/06/2022   HGB 12.0 (L) 10/06/2022   HCT 36.7 (L) 10/06/2022   MCV 92.0 10/06/2022   PLT 229 10/06/2022   No results found for: "NA", "K", "CL", "CO2" No results found for: "ALT", "AST", "GGT", "ALKPHOS", "BILITOT"   RADIOGRAPHY: NM PET (PSMA) SKULL TO MID THIGH  Result Date: 12/27/2022 CLINICAL DATA:  Prostate carcinoma intermediate risk, staging. EXAM: NUCLEAR MEDICINE PET SKULL BASE TO THIGH TECHNIQUE: 8.62 mCi F18 Piflufolastat (Pylarify) was injected intravenously. Full-ring PET imaging was performed from the skull base to thigh after the radiotracer. CT data was obtained and used for attenuation correction and anatomic localization. COMPARISON:  CT July 11, 2021 FINDINGS: NECK No radiotracer activity in neck lymph nodes. Incidental CT finding: None. CHEST No radiotracer accumulation within mediastinal or hilar lymph nodes. No radiotracer avid pulmonary nodules or masses. Incidental CT finding: Right-greater-than-left pleuroparenchymal scarring. Emphysema. Aortic atherosclerosis. ABDOMEN/PELVIS Prostate: Subtle low-level nodular focus of radiotracer avidity along the right side of the prostate gland max SUV of 3.7. Lymph nodes: No abnormal radiotracer accumulation within pelvic or abdominal nodes. Liver: No evidence of liver metastasis. Incidental CT finding: Aortic atherosclerosis. Colonic diverticulosis. Bladder wall diverticulum enlarged prostate gland. SKELETON No focal activity to suggest skeletal  metastasis. IMPRESSION: 1. Subtle low-level nodular focus of radiotracer avidity along the right side of the prostate gland, likely reflects patient's known primary prostate neoplasm. 2. No evidence of radiotracer avid metastatic disease. 3.  Aortic Atherosclerosis (ICD10-I70.0). Electronically Signed   By: Maudry Mayhew M.D.   On: 12/27/2022 09:37      IMPRESSION/PLAN: 1. 72 y.o. gentleman with Stage T1c adenocarcinoma of the prostate with Gleason Score of 4+3, and PSA of 4.5. We discussed the patient's  workup and outlined the nature of prostate cancer in this setting. The patient's T stage, Gleason's score, and PSA put him into the unfavorable intermediate risk group. Accordingly, he is eligible for a variety of potential treatment options including brachytherapy, 5.5 weeks of external radiation +/- ST-ADT, or prostatectomy. We discussed the available radiation techniques, and focused on the details and logistics of delivery.  Given his significant urinary symptoms despite medical management as well as bi-lobar prostatic hypertrophy with median lobe, we do not feel that he is a good brachytherapy candidate.  Therefore, we discussed and outlined the risks, benefits, short and long-term effects associated with daily external beam radiotherapy and compared and contrasted these with prostatectomy.  He understands that radiation does increase the risk of recurrent urethral stricture in the future so he will likely need to continue with periodic CIC/urethral dilation for prevention.  We discussed the potential benefit of prostatectomy in that it can address the bladder outlet obstruction and prostate cancer with a single procedure but the patient is adamantly not interested in prostatectomy.  Since he prefers to proceed with external radiation, he would likely benefit from a TURP procedure prior to starting radiation and could have fiducial markers and SpaceOAR gel placement at the time of that procedure.  We  discussed the role of SpaceOAR gel in reducing the rectal toxicity associated with radiotherapy. We also detailed the role of ADT in the treatment of unfavorable intermediate risk prostate cancer and outlined the associated side effects that could be expected with this therapy. In light of his very small volume disease, we do not feel strongly that there would be any significant benefit of adjuvant ADT and the risk for significant negative impact on his quality of life outweighs any potential small benefit. He was encouraged to ask questions that were answered to his stated satisfaction.  At the conclusion of our conversation, the patient is interested in moving forward with TURP followed by 5.5 weeks of external beam therapy without ADT but prefers to have his daily radiation treatments in [place] which is much closer to his home in [place], [place]. We will share our discussion with Dr. Pete Glatter so that he can proceed with surgical planning for TURP with fiducial markers and SpaceOAR gel placement, first available, in anticipation of beginning the daily radiation treatments approximately 8 weeks after his procedure.  We will also make a referral to Dr. Langston Masker in radiation oncology at [facility] to discuss having the daily radiation treatment closer to home.  The patient appears to have a good understanding of his disease and our treatment recommendations which are of curative intent and is in agreement with the stated plan.  We enjoyed meeting him today and look forward to following along in his care.  We personally spent 70 minutes in this encounter including chart review, reviewing radiological studies, meeting face-to-face with the patient, entering orders and completing documentation.    Marguarite Arbour, PA-C    Margaretmary Dys, MD  [facility]  Radiation Oncology Direct Dial: [phone]  Fax: [phone] [email or web address]  Skype  [email or web address]   This document serves as a record of services  personally performed by Margaretmary Dys, MD and Marcello Fennel, PA-C. It was created on their behalf by Mickie Bail, a trained medical scribe. The creation of this record is based on the scribe's personal observations and the provider's statements to them. This document has been checked and approved by the attending provider.

## 2023-01-12 ENCOUNTER — Ambulatory Visit
Admission: RE | Admit: 2023-01-12 | Discharge: 2023-01-12 | Disposition: A | Discharge status: Home / Self Care | Payer: Medicare HMO | Source: Ambulatory Visit | Attending: Radiation Oncology | Admitting: Radiation Oncology

## 2023-01-12 ENCOUNTER — Encounter: Payer: Self-pay | Admitting: Radiation Oncology

## 2023-01-12 ENCOUNTER — Other Ambulatory Visit: Payer: Self-pay

## 2023-01-12 VITALS — BP 152/86 | HR 63 | Temp 98.9°F | Resp 18 | Ht 67.0 in | Wt 164.0 lb

## 2023-01-12 DIAGNOSIS — I700 Atherosclerosis of aorta: Secondary | ICD-10-CM | POA: Diagnosis not present

## 2023-01-12 DIAGNOSIS — K5730 Diverticulosis of large intestine without perforation or abscess without bleeding: Secondary | ICD-10-CM | POA: Insufficient documentation

## 2023-01-12 DIAGNOSIS — N320 Bladder-neck obstruction: Secondary | ICD-10-CM | POA: Insufficient documentation

## 2023-01-12 DIAGNOSIS — C61 Malignant neoplasm of prostate: Secondary | ICD-10-CM | POA: Insufficient documentation

## 2023-01-12 DIAGNOSIS — J439 Emphysema, unspecified: Secondary | ICD-10-CM | POA: Insufficient documentation

## 2023-01-12 DIAGNOSIS — Z79899 Other long term (current) drug therapy: Secondary | ICD-10-CM | POA: Diagnosis not present

## 2023-01-12 DIAGNOSIS — Z87891 Personal history of nicotine dependence: Secondary | ICD-10-CM | POA: Insufficient documentation

## 2023-01-12 DIAGNOSIS — Z191 Hormone sensitive malignancy status: Secondary | ICD-10-CM | POA: Diagnosis not present

## 2023-01-12 DIAGNOSIS — Z8701 Personal history of pneumonia (recurrent): Secondary | ICD-10-CM | POA: Insufficient documentation

## 2023-01-13 ENCOUNTER — Other Ambulatory Visit: Payer: Self-pay | Admitting: Urology

## 2023-01-13 DIAGNOSIS — C61 Malignant neoplasm of prostate: Secondary | ICD-10-CM

## 2023-01-13 DIAGNOSIS — N401 Enlarged prostate with lower urinary tract symptoms: Secondary | ICD-10-CM

## 2023-01-20 DIAGNOSIS — Z1211 Encounter for screening for malignant neoplasm of colon: Secondary | ICD-10-CM | POA: Diagnosis not present

## 2023-01-20 DIAGNOSIS — C61 Malignant neoplasm of prostate: Secondary | ICD-10-CM | POA: Diagnosis not present

## 2023-01-21 DIAGNOSIS — H5203 Hypermetropia, bilateral: Secondary | ICD-10-CM | POA: Diagnosis not present

## 2023-01-21 DIAGNOSIS — H524 Presbyopia: Secondary | ICD-10-CM | POA: Diagnosis not present

## 2023-01-21 DIAGNOSIS — H52223 Regular astigmatism, bilateral: Secondary | ICD-10-CM | POA: Diagnosis not present

## 2023-01-27 ENCOUNTER — Ambulatory Visit: Payer: Self-pay | Admitting: Urology

## 2023-01-27 DIAGNOSIS — C61 Malignant neoplasm of prostate: Secondary | ICD-10-CM

## 2023-01-27 DIAGNOSIS — N138 Other obstructive and reflux uropathy: Secondary | ICD-10-CM

## 2023-01-27 NOTE — Progress Notes (Signed)
Surgery orders requested with Crystal at Dr. Laverle Hobby office.

## 2023-01-28 ENCOUNTER — Telehealth: Payer: Self-pay | Admitting: Urology

## 2023-01-28 NOTE — Telephone Encounter (Signed)
Stoneking, Danford Bad., MD  Carolin Coy, CMA; Claudie Leach This patient needs a preop appt with me to update his chart before his surgery on 02/09/23.  LVM for pt to call and sch.

## 2023-02-01 ENCOUNTER — Encounter: Payer: Self-pay | Admitting: Urology

## 2023-02-01 ENCOUNTER — Ambulatory Visit (INDEPENDENT_AMBULATORY_CARE_PROVIDER_SITE_OTHER): Payer: Medicare HMO | Admitting: Urology

## 2023-02-01 VITALS — BP 161/80 | HR 60 | Ht 67.0 in | Wt 155.0 lb

## 2023-02-01 DIAGNOSIS — R339 Retention of urine, unspecified: Secondary | ICD-10-CM

## 2023-02-01 DIAGNOSIS — N138 Other obstructive and reflux uropathy: Secondary | ICD-10-CM | POA: Diagnosis not present

## 2023-02-01 DIAGNOSIS — Z87440 Personal history of urinary (tract) infections: Secondary | ICD-10-CM | POA: Diagnosis not present

## 2023-02-01 DIAGNOSIS — N35912 Unspecified bulbous urethral stricture, male: Secondary | ICD-10-CM | POA: Diagnosis not present

## 2023-02-01 DIAGNOSIS — N401 Enlarged prostate with lower urinary tract symptoms: Secondary | ICD-10-CM

## 2023-02-01 DIAGNOSIS — C61 Malignant neoplasm of prostate: Secondary | ICD-10-CM

## 2023-02-01 LAB — URINALYSIS, ROUTINE W REFLEX MICROSCOPIC
Bilirubin, UA: NEGATIVE
Glucose, UA: NEGATIVE
Ketones, UA: NEGATIVE
Leukocytes,UA: NEGATIVE
Nitrite, UA: NEGATIVE
Protein,UA: NEGATIVE
RBC, UA: NEGATIVE
Specific Gravity, UA: 1.025 (ref 1.005–1.030)
Urobilinogen, Ur: 0.2 mg/dL (ref 0.2–1.0)
pH, UA: 6 (ref 5.0–7.5)

## 2023-02-01 MED ORDER — SILODOSIN 8 MG PO CAPS
8.0000 mg | ORAL_CAPSULE | Freq: Every evening | ORAL | 3 refills | Status: DC
Start: 2023-02-01 — End: 2023-11-04

## 2023-02-01 MED ORDER — NITROFURANTOIN MONOHYD MACRO 100 MG PO CAPS
100.0000 mg | ORAL_CAPSULE | Freq: Every day | ORAL | 2 refills | Status: DC
Start: 2023-02-01 — End: 2023-11-04

## 2023-02-01 NOTE — H&P (View-Only) (Signed)
Assessment: 1. BPH with obstruction/lower urinary tract symptoms   2. Prostate cancer (HCC);  PSA 4.5; GG 3; unfavorable intermediate risk   3. 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 History of UTI   5. Incomplete bladder emptying     Plan: Continue daily Macrobid for UTI prevention. Continue silodosin 8 mg daily.  Procedure: The patient will be scheduled for placement of fiducial markers, SpaceOAR placement, and transurethral resection of the prostate at [facility].  Surgical request is placed with the surgery schedulers and will be scheduled at the patient's/family request. Informed consent is given as documented below. Anesthesia: General  The patient does not have sleep apnea, history of MRSA, history of VRE, history of cardiac device requiring special anesthetic needs. Patient is stable and considered clear for surgical in an outpatient ambulatory surgery setting as well as patient hospital setting.  Consent for Operation or Procedure: Provider Certification I hereby certify that the nature, purpose, benefits, usual and most frequent risks of, and alternatives to, the operation or procedure have been explained to the patient (or person authorized to sign for the patient) either by me as responsible physician or by the provider who is to perform the operation or procedure. Time spent such that the patient/family has had an opportunity to ask questions, and that those questions have been answered. The patient or the patient's representative has been advised that selected tasks may be performed by assistants to the primary health care provider(s). I believe that the patient (or person authorized to sign for the patient) understands what has been explained, and has consented to the operation or procedure. No guarantees were implied or made.    Chief Complaint:  Chief Complaint  Patient presents with   Prostate Cancer    History of Present  Illness:  Louis Campos is a 72 y.o. year old male who is seen for further evaluation of BPH with obstruction, unfavorable intermediate risk prostate cancer, history of urethral stricture, and history of UTIs. He has a history of UTIs approximately once a year for several years beginning in 2014.  These resolved spontaneously.  About 1 year ago,  he noted foul-smelling and cloudy urine.  He was tested for UTI and by report his culture was negative.  He continued to have foul-smelling and cloudy urine.  He also reported urinary urgency at times, slight discomfort with voiding, weak stream, and nocturia 1-2 times. No gross hematuria or flank pain. IPSS = 13.  PVR = 350 mL. CT renal stone study from 07/11/2021 showed a small nonobstructing right renal stone, small calculus in the bladder, no evidence of renal mass or obstruction. Urine culture from 2/23 grew 25-50K staph haemolyticus. Treated with macrobid.  At his return visit in March 2023, he continued on alfuzosin.  He noted an improvement in the color and odor of his urine.  He continued to have a decreased stream, frequency, and sensation of incomplete emptying.  No dysuria or gross hematuria. IPSS = 15.  PVR = 330 ml. He was changed to silodosin 8 mg daily.  He continued on silodosin without significant change in his urinary symptoms.  He continued to have a weak stream, sensation of incomplete emptying, frequency and urgency. IPSS = 17. Cystoscopy demonstrated a narrow bulbar urethral stricture which would not allow passage of the cystoscope.  He underwent balloon dilation of the urethral stricture on 10/08/2021.  A OptiLume balloon was used.  He was discharged with a catheter in  place. He presented for a voiding trial on 10/13/2021.  He noted gross hematuria beginning 10/12/2021.[name]   His catheter was removed but he was unable to void and returned later that afternoon in retention.  A Foley catheter was placed with return of 750 mL of dark bloody  urine.  The catheter was irrigated with return of multiple small clots. His catheter was removed on 10/16/2021 after a successful voiding trial.  At his visit in 6/23, he was voiding spontaneously after foley removal. No gross hematuria.  No dysuria.  He continued with some frequency and urgency.  No significant change in his stream. PVR = 279 ml.  At his visit in 7/23, he continued to report frequency, decreased force of stream, and sensation of incomplete emptying.  His symptoms were primarily in the morning.  He had nocturia 0-1 time per night.  No dysuria or gross hematuria.  He continues on silodosin. IPSS = 14. PVR = 267 ml  He was seen by Dr. Annabell Howells in January 2024 following a UTI.  He had a flow rate and PVR in February 2024 which showed a peak flow of 4 mL/s.  Cystoscopy in April 2024 showed a normal anterior urethra but a short segment moderate to severe bulbar stricture that would not allow passage of the scope. Pelvic ultrasound from 09/08/2022 showed a postvoid volume of 371 mL.  No stones were seen within the bladder. PSA from 08/27/2022 was 4.5 with free PSA of 19.1%.   He continued to have lower urinary tract symptoms including weak stream, sensation of incomplete emptying, and urinary frequency.  No dysuria or gross hematuria.  No UTI symptoms.   IPSS = 17.  He underwent cystoscopy, DVIU, and balloon dilation of a bulbar urethral stricture on 10/14/2022.  He was discharged with a Foley catheter in place.   His Foley was removed on 10/16/2022 after a successful voiding trial.  At his visit in June 2024, he continued on daily Macrobid and Rapaflo. He continued to have lower urinary tract symptoms with decreased force of stream, sensation of incomplete emptying, and frequency.  No dysuria or gross hematuria. PVR = 326 ml  ExoDX score elevated at 29.53.  He is s/p transrectal ultrasound and biopsy of the prostate on 12/08/22. PSA: 4.5 ng/ml TRUS volume:  40 ml  PSA density:   0.11 Biopsy results:             Gleason score: 4+ 3 = 7            # positive cores: 1/6 on right    0/6 on left            Location of cancer: mid gland  Complications after biopsy: none Patient with significant LUTS.    IPSS = 16 Patient without erectile dysfunction.   PSA May PET scan from 12/27/2022 showed uptake in the right side of the prostate without evidence of metastatic disease.  Treatment options were discussed with the patient.  He has elected to proceed with IMRT. Management of his BPH with lower urinary tract symptoms prior to initiation of radiation therapy has been recommended.  Options discussed.  He is elected to proceed with a transurethral resection of the prostate.  He presents today for preoperative evaluation.  He continues on silodosin and daily Macrobid.  No change in his lower urinary tract symptoms.  He continues with incomplete emptying, decreased stream, and frequency.  No dysuria or gross hematuria. IPSS = 50 pain today.  Portions of  the above documentation were copied from a prior visit for review purposes only.  Past Medical History:  Past Medical History:  Diagnosis Date   Acid reflux    patient denies   Pneumonia     Past Surgical History:  Past Surgical History:  Procedure Laterality Date   BALLOON DILATION Bilateral 10/13/2022   Procedure: BALLOON DILATION OF URETHRAL STRICTURE;  Surgeon: Milderd Meager., MD;  Location: [facility];  Service: Urology;  Laterality: Bilateral;   BIOPSY  09/09/2021   Procedure: BIOPSY;  Surgeon: Lanelle Bal, DO;  [facility];  Service: Endoscopy;;   CYSTOSCOPY WITH URETHRAL DILATATION N/A 10/08/2021   Procedure: CYSTOSCOPY WITH URETHRAL DILATATION- balloon dilatation with OPTILUME;  Surgeon: Milderd Meager., MD;  Location: [facility];  Service: Urology;  Laterality: N/A;   CYSTOSCOPY WITH URETHRAL DILATATION Bilateral 10/13/2022   Procedure: CYSTOSCOPY WITH DIRECT VISUAL INTERNAL URETHROTOMY,  URETHRAL DILATATION;  Surgeon: Milderd Meager., MD;  Location: [facility];  Service: Urology;  Laterality: Bilateral;   ESOPHAGEAL BRUSHING  09/09/2021   Procedure: ESOPHAGEAL BRUSHING;  Surgeon: Lanelle Bal, DO;  [facility];  Service: Endoscopy;;   ESOPHAGOGASTRODUODENOSCOPY (EGD) WITH PROPOFOL N/A 09/09/2021   Procedure: ESOPHAGOGASTRODUODENOSCOPY (EGD) WITH PROPOFOL;  Surgeon: Lanelle Bal, DO;  [facility];  Service: Endoscopy;  Laterality: N/A;  2:15pm   HAND SURGERY     right   PROSTATE BIOPSY      Allergies:  No Known Allergies  Family History:  Family History  Problem Relation Age of Onset   Colon cancer Neg Hx    Esophageal cancer Neg Hx     Social History:  Social History   Tobacco Use   Smoking status: Former    Current packs/day: 0.00    Average packs/day: 1.5 packs/day for 20.0 years (30.0 ttl pk-yrs)    Types: Cigarettes    Start date: 41    Quit date: 2000    Years since quitting: 24.7    Passive exposure: Past   Smokeless tobacco: Never  Vaping Use   Vaping status: Never Used  Substance Use Topics   Alcohol use: Yes    Alcohol/week: 1.0 standard drink of alcohol    Types: 1 Cans of beer per week    Comment: couple of shots every week.   Drug use: Not Currently    ROS: Constitutional:  Negative for fever, chills, weight loss CV: Negative for chest pain, previous MI, hypertension Respiratory:  Negative for shortness of breath, wheezing, sleep apnea, frequent cough GI:  Negative for nausea, vomiting, bloody stool, GERD  Physical exam: BP (!) 161/80   Pulse 60   Ht 5\' 7"  (1.702 m)   Wt 155 lb (70.3 kg)   BMI 24.28 kg/m  GENERAL APPEARANCE:  Well appearing, well developed, well nourished, NAD HEENT:  Atraumatic, normocephalic, oropharynx clear NECK:  Supple without lymphadenopathy or thyromegaly ABDOMEN:  Soft, non-tender, no masses EXTREMITIES:  Moves all extremities well, without clubbing, cyanosis, or  edema NEUROLOGIC:  Alert and oriented x 3, normal gait, CN II-XII grossly intact MENTAL STATUS:  appropriate BACK:  Non-tender to palpation, No CVAT SKIN:  Warm, dry, and intact  Results: U/A: negative

## 2023-02-01 NOTE — Progress Notes (Signed)
Assessment: 1. BPH with obstruction/lower urinary tract symptoms   2. Prostate cancer (HCC);  PSA 4.5; GG 3; unfavorable intermediate risk   3. 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 History of UTI   5. Incomplete bladder emptying     Plan: Continue daily Macrobid for UTI prevention. Continue silodosin 8 mg daily.  Procedure: The patient will be scheduled for placement of fiducial markers, SpaceOAR placement, and transurethral resection of the prostate at [facility].  Surgical request is placed with the surgery schedulers and will be scheduled at the patient's/family request. Informed consent is given as documented below. Anesthesia: General  The patient does not have sleep apnea, history of MRSA, history of VRE, history of cardiac device requiring special anesthetic needs. Patient is stable and considered clear for surgical in an outpatient ambulatory surgery setting as well as patient hospital setting.  Consent for Operation or Procedure: Provider Certification I hereby certify that the nature, purpose, benefits, usual and most frequent risks of, and alternatives to, the operation or procedure have been explained to the patient (or person authorized to sign for the patient) either by me as responsible physician or by the provider who is to perform the operation or procedure. Time spent such that the patient/family has had an opportunity to ask questions, and that those questions have been answered. The patient or the patient's representative has been advised that selected tasks may be performed by assistants to the primary health care provider(s). I believe that the patient (or person authorized to sign for the patient) understands what has been explained, and has consented to the operation or procedure. No guarantees were implied or made.    Chief Complaint:  Chief Complaint  Patient presents with   Prostate Cancer    History of Present  Illness:  Louis Campos is a 72 y.o. year old male who is seen for further evaluation of BPH with obstruction, unfavorable intermediate risk prostate cancer, history of urethral stricture, and history of UTIs. He has a history of UTIs approximately once a year for several years beginning in 2014.  These resolved spontaneously.  About 1 year ago,  he noted foul-smelling and cloudy urine.  He was tested for UTI and by report his culture was negative.  He continued to have foul-smelling and cloudy urine.  He also reported urinary urgency at times, slight discomfort with voiding, weak stream, and nocturia 1-2 times. No gross hematuria or flank pain. IPSS = 13.  PVR = 350 mL. CT renal stone study from 07/11/2021 showed a small nonobstructing right renal stone, small calculus in the bladder, no evidence of renal mass or obstruction. Urine culture from 2/23 grew 25-50K staph haemolyticus. Treated with macrobid.  At his return visit in March 2023, he continued on alfuzosin.  He noted an improvement in the color and odor of his urine.  He continued to have a decreased stream, frequency, and sensation of incomplete emptying.  No dysuria or gross hematuria. IPSS = 15.  PVR = 330 ml. He was changed to silodosin 8 mg daily.  He continued on silodosin without significant change in his urinary symptoms.  He continued to have a weak stream, sensation of incomplete emptying, frequency and urgency. IPSS = 17. Cystoscopy demonstrated a narrow bulbar urethral stricture which would not allow passage of the cystoscope.  He underwent balloon dilation of the urethral stricture on 10/08/2021.  A OptiLume balloon was used.  He was discharged with a catheter in  place. He presented for a voiding trial on 10/13/2021.  He noted gross hematuria beginning 10/12/2021.[name]   His catheter was removed but he was unable to void and returned later that afternoon in retention.  A Foley catheter was placed with return of 750 mL of dark bloody  urine.  The catheter was irrigated with return of multiple small clots. His catheter was removed on 10/16/2021 after a successful voiding trial.  At his visit in 6/23, he was voiding spontaneously after foley removal. No gross hematuria.  No dysuria.  He continued with some frequency and urgency.  No significant change in his stream. PVR = 279 ml.  At his visit in 7/23, he continued to report frequency, decreased force of stream, and sensation of incomplete emptying.  His symptoms were primarily in the morning.  He had nocturia 0-1 time per night.  No dysuria or gross hematuria.  He continues on silodosin. IPSS = 14. PVR = 267 ml  He was seen by Dr. Annabell Howells in January 2024 following a UTI.  He had a flow rate and PVR in February 2024 which showed a peak flow of 4 mL/s.  Cystoscopy in April 2024 showed a normal anterior urethra but a short segment moderate to severe bulbar stricture that would not allow passage of the scope. Pelvic ultrasound from 09/08/2022 showed a postvoid volume of 371 mL.  No stones were seen within the bladder. PSA from 08/27/2022 was 4.5 with free PSA of 19.1%.   He continued to have lower urinary tract symptoms including weak stream, sensation of incomplete emptying, and urinary frequency.  No dysuria or gross hematuria.  No UTI symptoms.   IPSS = 17.  He underwent cystoscopy, DVIU, and balloon dilation of a bulbar urethral stricture on 10/14/2022.  He was discharged with a Foley catheter in place.   His Foley was removed on 10/16/2022 after a successful voiding trial.  At his visit in June 2024, he continued on daily Macrobid and Rapaflo. He continued to have lower urinary tract symptoms with decreased force of stream, sensation of incomplete emptying, and frequency.  No dysuria or gross hematuria. PVR = 326 ml  ExoDX score elevated at 29.53.  He is s/p transrectal ultrasound and biopsy of the prostate on 12/08/22. PSA: 4.5 ng/ml TRUS volume:  40 ml  PSA density:   0.11 Biopsy results:             Gleason score: 4+ 3 = 7            # positive cores: 1/6 on right    0/6 on left            Location of cancer: mid gland  Complications after biopsy: none Patient with significant LUTS.    IPSS = 16 Patient without erectile dysfunction.   PSA May PET scan from 12/27/2022 showed uptake in the right side of the prostate without evidence of metastatic disease.  Treatment options were discussed with the patient.  He has elected to proceed with IMRT. Management of his BPH with lower urinary tract symptoms prior to initiation of radiation therapy has been recommended.  Options discussed.  He is elected to proceed with a transurethral resection of the prostate.  He presents today for preoperative evaluation.  He continues on silodosin and daily Macrobid.  No change in his lower urinary tract symptoms.  He continues with incomplete emptying, decreased stream, and frequency.  No dysuria or gross hematuria. IPSS = 50 pain today.  Portions of  the above documentation were copied from a prior visit for review purposes only.  Past Medical History:  Past Medical History:  Diagnosis Date   Acid reflux    patient denies   Pneumonia     Past Surgical History:  Past Surgical History:  Procedure Laterality Date   BALLOON DILATION Bilateral 10/13/2022   Procedure: BALLOON DILATION OF URETHRAL STRICTURE;  Surgeon: Milderd Meager., MD;  Location: [facility];  Service: Urology;  Laterality: Bilateral;   BIOPSY  09/09/2021   Procedure: BIOPSY;  Surgeon: Lanelle Bal, DO;  [facility];  Service: Endoscopy;;   CYSTOSCOPY WITH URETHRAL DILATATION N/A 10/08/2021   Procedure: CYSTOSCOPY WITH URETHRAL DILATATION- balloon dilatation with OPTILUME;  Surgeon: Milderd Meager., MD;  Location: [facility];  Service: Urology;  Laterality: N/A;   CYSTOSCOPY WITH URETHRAL DILATATION Bilateral 10/13/2022   Procedure: CYSTOSCOPY WITH DIRECT VISUAL INTERNAL URETHROTOMY,  URETHRAL DILATATION;  Surgeon: Milderd Meager., MD;  Location: [facility];  Service: Urology;  Laterality: Bilateral;   ESOPHAGEAL BRUSHING  09/09/2021   Procedure: ESOPHAGEAL BRUSHING;  Surgeon: Lanelle Bal, DO;  [facility];  Service: Endoscopy;;   ESOPHAGOGASTRODUODENOSCOPY (EGD) WITH PROPOFOL N/A 09/09/2021   Procedure: ESOPHAGOGASTRODUODENOSCOPY (EGD) WITH PROPOFOL;  Surgeon: Lanelle Bal, DO;  [facility];  Service: Endoscopy;  Laterality: N/A;  2:15pm   HAND SURGERY     right   PROSTATE BIOPSY      Allergies:  No Known Allergies  Family History:  Family History  Problem Relation Age of Onset   Colon cancer Neg Hx    Esophageal cancer Neg Hx     Social History:  Social History   Tobacco Use   Smoking status: Former    Current packs/day: 0.00    Average packs/day: 1.5 packs/day for 20.0 years (30.0 ttl pk-yrs)    Types: Cigarettes    Start date: 69    Quit date: 2000    Years since quitting: 24.7    Passive exposure: Past   Smokeless tobacco: Never  Vaping Use   Vaping status: Never Used  Substance Use Topics   Alcohol use: Yes    Alcohol/week: 1.0 standard drink of alcohol    Types: 1 Cans of beer per week    Comment: couple of shots every week.   Drug use: Not Currently    ROS: Constitutional:  Negative for fever, chills, weight loss CV: Negative for chest pain, previous MI, hypertension Respiratory:  Negative for shortness of breath, wheezing, sleep apnea, frequent cough GI:  Negative for nausea, vomiting, bloody stool, GERD  Physical exam: BP (!) 161/80   Pulse 60   Ht 5\' 7"  (1.702 m)   Wt 155 lb (70.3 kg)   BMI 24.28 kg/m  GENERAL APPEARANCE:  Well appearing, well developed, well nourished, NAD HEENT:  Atraumatic, normocephalic, oropharynx clear NECK:  Supple without lymphadenopathy or thyromegaly ABDOMEN:  Soft, non-tender, no masses EXTREMITIES:  Moves all extremities well, without clubbing, cyanosis, or  edema NEUROLOGIC:  Alert and oriented x 3, normal gait, CN II-XII grossly intact MENTAL STATUS:  appropriate BACK:  Non-tender to palpation, No CVAT SKIN:  Warm, dry, and intact  Results: U/A: negative

## 2023-02-02 NOTE — Progress Notes (Signed)
COVID Vaccine received:  []  No [x]  Yes Date of any COVID positive Test in last 90 days:  PCP - Quintin Alto, MD  Cardiologist -   Chest x-ray -  EKG -  ?  No history to warrant Stress Test -  ECHO -  Cardiac Cath -   PCR screen: []  Ordered & Completed           []   No Order but Needs PROFEND           [x]   N/A for this surgery  Surgery Plan:  [x]  Ambulatory   []  Outpatient in bed  []  Admit  Anesthesia:    [x]  General  []  Spinal  []   Choice []   MAC  Bowel Prep - []  No  []   Yes ______  Pacemaker / ICD device [x]  No []  Yes   Spinal Cord Stimulator:[x]  No []  Yes       History of Sleep Apnea? [x]  No []  Yes   CPAP used?- [x]  No []  Yes    Does the patient monitor blood sugar?  []  No []  Yes  [x]  N/A  Patient has: [x]  NO Hx DM   []  Pre-DM   []  DM1  []   DM2 Last A1c was:        on       Blood Thinner / Instructions: None Aspirin Instructions:  None  ERAS Protocol Ordered: [x]  No  []  Yes Patient is to be NPO after: /midnight  Comments:   Activity level: Patient is able / unable to climb a flight of stairs without difficulty; []  No CP  []  No SOB, but would have ___   Patient can / can not perform ADLs without assistance.   Anesthesia review: GERD, no pertinent medical or surgical history  Patient denies shortness of breath, fever, cough and chest pain at PAT appointment.  Patient verbalized understanding and agreement to the Pre-Surgical Instructions that were given to them at this PAT appointment. Patient was also educated of the need to review these PAT instructions again prior to his surgery.I reviewed the appropriate phone numbers to call if they have any and questions or concerns.

## 2023-02-02 NOTE — Patient Instructions (Signed)
SURGICAL WAITING ROOM VISITATION Patients having surgery or a procedure may have no more than 2 support people in the waiting area - these visitors may rotate in the visitor waiting room.   Due to an increase in RSV and influenza rates and associated hospitalizations, children ages 29 and under may not visit patients in [facility] hospitals. If the patient needs to stay at the hospital during part of their recovery, the visitor guidelines for inpatient rooms apply.  PRE-OP VISITATION  Pre-op nurse will coordinate an appropriate time for 1 support person to accompany the patient in pre-op.  This support person may not rotate.  This visitor will be contacted when the time is appropriate for the visitor to come back in the pre-op area.  Please refer to the [facility] website for the visitor guidelines for Inpatients (after your surgery is over and you are in a regular room).  You are not required to quarantine at this time prior to your surgery. However, you must do this: Hand Hygiene often Do NOT share personal items Notify your provider if you are in close contact with someone who has COVID or you develop fever 100.4 or greater, new onset of sneezing, cough, sore throat, shortness of breath or body aches.  If you test positive for Covid or have been in contact with anyone that has tested positive in the last 10 days please notify you surgeon.    Your procedure is scheduled on:   Tuesday  February 09, 2023   Report to [facility] Main Entrance: [address] entrance where the [employer] is available.   Report to admitting at:  09:45   AM  Call this number if you have any questions or problems the morning of surgery [phone]  DO NOT EAT OR DRINK ANYTHING AFTER MIDNIGHT THE NIGHT PRIOR TO YOUR SURGERY / PROCEDURE.   FOLLOW BOWEL PREP AND ANY ADDITIONAL PRE OP INSTRUCTIONS YOU RECEIVED FROM YOUR SURGEON'S OFFICE!!!   Oral Hygiene is also important to reduce your risk of  infection.        Remember - BRUSH YOUR TEETH THE MORNING OF SURGERY WITH YOUR REGULAR TOOTHPASTE  Do NOT smoke after Midnight the night before surgery.  STOP TAKING all Vitamins, Herbs and supplements 1 week before your surgery.   Take ONLY these medicines the morning of surgery with A SIP OF WATER: Pantoprazole (Protonix),  Macrobid ???   You may not have any metal on your body including  jewelry, and body piercing  Do not wear  lotions, powders, cologne, or deodorant  Men may shave face and neck.  Contacts, Hearing Aids, dentures or bridgework may not be worn into surgery. DENTURES WILL BE REMOVED PRIOR TO SURGERY PLEASE DO NOT APPLY "Poly grip" OR ADHESIVES!!!  Patients discharged on the day of surgery will not be allowed to drive home.  Someone NEEDS to stay with you for the first 24 hours after anesthesia.  Do not bring your home medications to the hospital. The Pharmacy will dispense medications listed on your medication list to you during your admission in the Hospital.  Special Instructions: Bring a copy of your healthcare power of attorney and living will documents the day of surgery, if you wish to have them scanned into your  Medical Records- EPIC  Please read over the following fact sheets you were given: IF YOU HAVE QUESTIONS ABOUT YOUR PRE-OP INSTRUCTIONS, PLEASE CALL [phone].   [facility] - Preparing for Surgery Before surgery, you can play  an important role.  Because skin is not sterile, your skin needs to be as free of germs as possible.  You can reduce the number of germs on your skin by washing with CHG (chlorahexidine gluconate) soap before surgery.  CHG is an antiseptic cleaner which kills germs and bonds with the skin to continue killing germs even after washing. Please DO NOT use if you have an allergy to CHG or antibacterial soaps.  If your skin becomes reddened/irritated stop using the CHG and inform your nurse when you arrive at Short Stay. Do  not shave (including legs and underarms) for at least 48 hours prior to the first CHG shower.  You may shave your face/neck.  Please follow these instructions carefully:  1.  Shower with CHG Soap the night before surgery and the  morning of surgery.  2.  If you choose to wash your hair, wash your hair first as usual with your normal  shampoo.  3.  After you shampoo, rinse your hair and body thoroughly to remove the shampoo.                             4.  Use CHG as you would any other liquid soap.  You can apply chg directly to the skin and wash.  Gently with a scrungie or clean washcloth.  5.  Apply the CHG Soap to your body ONLY FROM THE NECK DOWN.   Do not use on face/ open                           Wound or open sores. Avoid contact with eyes, ears mouth and genitals (private parts).                       Wash face,  Genitals (private parts) with your normal soap.             6.  Wash thoroughly, paying special attention to the area where your  surgery  will be performed.  7.  Thoroughly rinse your body with warm water from the neck down.  8.  DO NOT shower/wash with your normal soap after using and rinsing off the CHG Soap.            9.  Pat yourself dry with a clean towel.            10.  Wear clean pajamas.            11.  Place clean sheets on your bed the night of your first shower and do not  sleep with pets.  ON THE DAY OF SURGERY : Do not apply any lotions/deodorants the morning of surgery.  Please wear clean clothes to the hospital/surgery center.    FAILURE TO FOLLOW THESE INSTRUCTIONS MAY RESULT IN THE CANCELLATION OF YOUR SURGERY  PATIENT SIGNATURE_________________________________  NURSE SIGNATURE__________________________________  ________________________________________________________________________

## 2023-02-03 ENCOUNTER — Telehealth: Payer: Self-pay | Admitting: Urology

## 2023-02-03 ENCOUNTER — Other Ambulatory Visit: Payer: Self-pay

## 2023-02-03 ENCOUNTER — Encounter (HOSPITAL_COMMUNITY)
Admission: RE | Admit: 2023-02-03 | Discharge: 2023-02-03 | Disposition: A | Discharge status: Home / Self Care | Payer: Medicare HMO | Source: Ambulatory Visit | Attending: Urology | Admitting: Urology

## 2023-02-03 ENCOUNTER — Ambulatory Visit: Payer: Self-pay | Admitting: Urology

## 2023-02-03 ENCOUNTER — Encounter (HOSPITAL_COMMUNITY): Payer: Self-pay

## 2023-02-03 VITALS — BP 139/86 | HR 63 | Temp 98.8°F | Resp 18 | Ht 67.0 in | Wt 161.0 lb

## 2023-02-03 DIAGNOSIS — R000 Tachycardia, unspecified: Secondary | ICD-10-CM | POA: Insufficient documentation

## 2023-02-03 DIAGNOSIS — N401 Enlarged prostate with lower urinary tract symptoms: Secondary | ICD-10-CM | POA: Diagnosis not present

## 2023-02-03 DIAGNOSIS — I10 Essential (primary) hypertension: Secondary | ICD-10-CM | POA: Diagnosis not present

## 2023-02-03 DIAGNOSIS — Z01818 Encounter for other preprocedural examination: Secondary | ICD-10-CM | POA: Diagnosis not present

## 2023-02-03 DIAGNOSIS — Z01810 Encounter for preprocedural cardiovascular examination: Secondary | ICD-10-CM | POA: Diagnosis not present

## 2023-02-03 DIAGNOSIS — Z01812 Encounter for preprocedural laboratory examination: Secondary | ICD-10-CM | POA: Insufficient documentation

## 2023-02-03 DIAGNOSIS — C61 Malignant neoplasm of prostate: Secondary | ICD-10-CM | POA: Diagnosis not present

## 2023-02-03 DIAGNOSIS — N138 Other obstructive and reflux uropathy: Secondary | ICD-10-CM | POA: Diagnosis not present

## 2023-02-03 HISTORY — DX: Unspecified osteoarthritis, unspecified site: M19.90

## 2023-02-03 HISTORY — DX: Malignant (primary) neoplasm, unspecified: C80.1

## 2023-02-03 HISTORY — DX: Essential (primary) hypertension: I10

## 2023-02-03 LAB — CBC
HCT: 35.9 % — ABNORMAL LOW (ref 39.0–52.0)
Hemoglobin: 11.9 g/dL — ABNORMAL LOW (ref 13.0–17.0)
MCH: 30.6 pg (ref 26.0–34.0)
MCHC: 33.1 g/dL (ref 30.0–36.0)
MCV: 92.3 fL (ref 80.0–100.0)
Platelets: 264 10*3/uL (ref 150–400)
RBC: 3.89 MIL/uL — ABNORMAL LOW (ref 4.22–5.81)
RDW: 12 % (ref 11.5–15.5)
WBC: 5.5 10*3/uL (ref 4.0–10.5)
nRBC: 0 % (ref 0.0–0.2)

## 2023-02-03 LAB — BASIC METABOLIC PANEL
Anion gap: 10 (ref 5–15)
BUN: 20 mg/dL (ref 8–23)
CO2: 23 mmol/L (ref 22–32)
Calcium: 8.6 mg/dL — ABNORMAL LOW (ref 8.9–10.3)
Chloride: 105 mmol/L (ref 98–111)
Creatinine, Ser: 0.97 mg/dL (ref 0.61–1.24)
GFR, Estimated: >60 mL/min (ref >60)
Glucose, Bld: 87 mg/dL (ref 70–99)
Potassium: 3.7 mmol/L (ref 3.5–5.1)
Sodium: 138 mmol/L (ref 135–145)

## 2023-02-03 NOTE — Telephone Encounter (Signed)
Louis Campos at [facility] pre op - [phone]   Called regarding consent and surgery name change for this patient.

## 2023-02-09 ENCOUNTER — Encounter (HOSPITAL_COMMUNITY): Payer: Self-pay | Admitting: Urology

## 2023-02-09 ENCOUNTER — Encounter (HOSPITAL_COMMUNITY)
Admission: RE | Disposition: A | Discharge status: Home / Self Care | Payer: Self-pay | Source: Ambulatory Visit | Attending: Urology

## 2023-02-09 ENCOUNTER — Other Ambulatory Visit: Payer: Self-pay

## 2023-02-09 ENCOUNTER — Ambulatory Visit (HOSPITAL_BASED_OUTPATIENT_CLINIC_OR_DEPARTMENT_OTHER): Payer: Self-pay

## 2023-02-09 ENCOUNTER — Ambulatory Visit (HOSPITAL_COMMUNITY): Payer: Medicare HMO

## 2023-02-09 ENCOUNTER — Ambulatory Visit (HOSPITAL_COMMUNITY)
Admission: RE | Admit: 2023-02-09 | Discharge: 2023-02-10 | Disposition: A | Discharge status: Home / Self Care | Payer: Medicare HMO | Source: Ambulatory Visit | Attending: Urology | Admitting: Urology

## 2023-02-09 DIAGNOSIS — N35912 Unspecified bulbous urethral stricture, male: Secondary | ICD-10-CM | POA: Insufficient documentation

## 2023-02-09 DIAGNOSIS — I10 Essential (primary) hypertension: Secondary | ICD-10-CM | POA: Diagnosis not present

## 2023-02-09 DIAGNOSIS — Z87440 Personal history of urinary (tract) infections: Secondary | ICD-10-CM | POA: Insufficient documentation

## 2023-02-09 DIAGNOSIS — M1990 Unspecified osteoarthritis, unspecified site: Secondary | ICD-10-CM | POA: Insufficient documentation

## 2023-02-09 DIAGNOSIS — Z87891 Personal history of nicotine dependence: Secondary | ICD-10-CM | POA: Insufficient documentation

## 2023-02-09 DIAGNOSIS — R338 Other retention of urine: Secondary | ICD-10-CM | POA: Diagnosis not present

## 2023-02-09 DIAGNOSIS — C61 Malignant neoplasm of prostate: Secondary | ICD-10-CM | POA: Diagnosis not present

## 2023-02-09 DIAGNOSIS — N138 Other obstructive and reflux uropathy: Secondary | ICD-10-CM | POA: Diagnosis not present

## 2023-02-09 DIAGNOSIS — N400 Enlarged prostate without lower urinary tract symptoms: Secondary | ICD-10-CM

## 2023-02-09 DIAGNOSIS — Z87448 Personal history of other diseases of urinary system: Secondary | ICD-10-CM

## 2023-02-09 DIAGNOSIS — N401 Enlarged prostate with lower urinary tract symptoms: Secondary | ICD-10-CM | POA: Insufficient documentation

## 2023-02-09 HISTORY — PX: TRANSURETHRAL RESECTION OF PROSTATE: SHX73

## 2023-02-09 HISTORY — PX: CYSTOSCOPY: SHX5120

## 2023-02-09 SURGERY — CYSTOSCOPY
Anesthesia: General

## 2023-02-09 MED ORDER — STERILE WATER FOR IRRIGATION IR SOLN
Status: DC | PRN
  Administered 2023-02-09: 1000 mL

## 2023-02-09 MED ORDER — ACETAMINOPHEN 500 MG PO TABS
1000.0000 mg | ORAL_TABLET | Freq: Once | ORAL | Status: AC
  Administered 2023-02-09: 1000 mg via ORAL
  Filled 2023-02-09: qty 2

## 2023-02-09 MED ORDER — ONDANSETRON HCL 4 MG/2ML IJ SOLN: 4.0000 mg | INTRAMUSCULAR | Status: DC | PRN

## 2023-02-09 MED ORDER — PHENYLEPHRINE HCL (PRESSORS) 10 MG/ML IV SOLN
INTRAVENOUS | Status: DC | PRN
  Administered 2023-02-09: 80 ug via INTRAVENOUS

## 2023-02-09 MED ORDER — SODIUM CHLORIDE 0.9 % IR SOLN
Status: DC | PRN
  Administered 2023-02-09: 15000 mL via INTRAVESICAL

## 2023-02-09 MED ORDER — LIDOCAINE HCL (CARDIAC) PF 100 MG/5ML IV SOSY
PREFILLED_SYRINGE | INTRAVENOUS | Status: DC | PRN
  Administered 2023-02-09: 60 mg via INTRAVENOUS

## 2023-02-09 MED ORDER — DEXTROSE-SODIUM CHLORIDE 5-0.45 % IV SOLN: INTRAVENOUS | Status: DC

## 2023-02-09 MED ORDER — FENTANYL CITRATE (PF) 100 MCG/2ML IJ SOLN
INTRAMUSCULAR | Status: DC | PRN
  Administered 2023-02-09: 50 ug via INTRAVENOUS

## 2023-02-09 MED ORDER — OXYCODONE HCL 5 MG PO TABS: 5.0000 mg | ORAL_TABLET | Freq: Once | ORAL | Status: DC | PRN

## 2023-02-09 MED ORDER — DEXAMETHASONE SODIUM PHOSPHATE 4 MG/ML IJ SOLN
INTRAMUSCULAR | Status: DC | PRN
  Administered 2023-02-09: 5 mg via INTRAVENOUS

## 2023-02-09 MED ORDER — PANTOPRAZOLE SODIUM 40 MG PO TBEC
40.0000 mg | DELAYED_RELEASE_TABLET | Freq: Every day | ORAL | Status: DC
  Administered 2023-02-09 – 2023-02-10 (×2): 40 mg via ORAL
  Filled 2023-02-09 (×2): qty 1

## 2023-02-09 MED ORDER — AMISULPRIDE (ANTIEMETIC) 5 MG/2ML IV SOLN: 10.0000 mg | Freq: Once | INTRAVENOUS | Status: DC | PRN

## 2023-02-09 MED ORDER — FENTANYL CITRATE (PF) 100 MCG/2ML IJ SOLN
INTRAMUSCULAR | Status: AC
  Filled 2023-02-09: qty 2

## 2023-02-09 MED ORDER — FENTANYL CITRATE PF 50 MCG/ML IJ SOSY: 25.0000 ug | PREFILLED_SYRINGE | INTRAMUSCULAR | Status: DC | PRN

## 2023-02-09 MED ORDER — ONDANSETRON HCL 4 MG/2ML IJ SOLN: 4.0000 mg | Freq: Once | INTRAMUSCULAR | Status: DC | PRN

## 2023-02-09 MED ORDER — ONDANSETRON HCL 4 MG/2ML IJ SOLN
INTRAMUSCULAR | Status: DC | PRN
  Administered 2023-02-09: 4 mg via INTRAVENOUS

## 2023-02-09 MED ORDER — ORAL CARE MOUTH RINSE: 15.0000 mL | Freq: Once | OROMUCOSAL | Status: AC

## 2023-02-09 MED ORDER — ACETAMINOPHEN 325 MG PO TABS
650.0000 mg | ORAL_TABLET | ORAL | Status: DC | PRN
  Administered 2023-02-09: 650 mg via ORAL
  Filled 2023-02-09: qty 2

## 2023-02-09 MED ORDER — PROPOFOL 10 MG/ML IV BOLUS
INTRAVENOUS | Status: DC | PRN
  Administered 2023-02-09: 150 mg via INTRAVENOUS

## 2023-02-09 MED ORDER — LACTATED RINGERS IV SOLN: INTRAVENOUS | Status: DC

## 2023-02-09 MED ORDER — LEVOFLOXACIN IN D5W 500 MG/100ML IV SOLN
500.0000 mg | INTRAVENOUS | Status: AC
  Administered 2023-02-09 (×2): 500 mg via INTRAVENOUS
  Filled 2023-02-09 (×2): qty 100

## 2023-02-09 MED ORDER — EPHEDRINE SULFATE (PRESSORS) 50 MG/ML IJ SOLN
INTRAMUSCULAR | Status: DC | PRN
  Administered 2023-02-09: 10 mg via INTRAVENOUS

## 2023-02-09 MED ORDER — OXYCODONE HCL 5 MG/5ML PO SOLN: 5.0000 mg | Freq: Once | ORAL | Status: DC | PRN

## 2023-02-09 MED ORDER — CHLORHEXIDINE GLUCONATE 0.12 % MT SOLN
15.0000 mL | Freq: Once | OROMUCOSAL | Status: AC
  Administered 2023-02-09: 15 mL via OROMUCOSAL

## 2023-02-09 MED ORDER — SENNOSIDES-DOCUSATE SODIUM 8.6-50 MG PO TABS
2.0000 | ORAL_TABLET | Freq: Every day | ORAL | Status: DC
  Administered 2023-02-09: 2 via ORAL
  Filled 2023-02-09: qty 2

## 2023-02-09 MED ORDER — MIDAZOLAM HCL 2 MG/2ML IJ SOLN
INTRAMUSCULAR | Status: AC
  Filled 2023-02-09: qty 2

## 2023-02-09 MED ORDER — HYDROCODONE-ACETAMINOPHEN 5-325 MG PO TABS: 1.0000 | ORAL_TABLET | ORAL | Status: DC | PRN

## 2023-02-09 MED ORDER — SODIUM CHLORIDE 0.9 % IR SOLN
3000.0000 mL | Status: DC
  Administered 2023-02-09 (×2): 3000 mL

## 2023-02-09 MED ORDER — MIDAZOLAM HCL 5 MG/5ML IJ SOLN
INTRAMUSCULAR | Status: DC | PRN
  Administered 2023-02-09: 2 mg via INTRAVENOUS

## 2023-02-09 SURGICAL SUPPLY — 20 items
BAG DRN RND TRDRP ANRFLXCHMBR (UROLOGICAL SUPPLIES) ×2
BAG URINE DRAIN 2000ML AR STRL (UROLOGICAL SUPPLIES) ×2 IMPLANT
BAG URO CATCHER STRL LF (MISCELLANEOUS) ×2 IMPLANT
CATH FOLEY 3WAY 30CC 24FR (CATHETERS) ×2
CATH HEMA 3WAY 30CC 22FR COUDE (CATHETERS) IMPLANT
CATH URTH STD 24FR FL 3W 2 (CATHETERS) IMPLANT
DRAPE FOOT SWITCH (DRAPES) ×2 IMPLANT
EVACUATOR MICROVAS BLADDER (UROLOGICAL SUPPLIES) IMPLANT
GLOVE BIO SURGEON STRL SZ8.5 (GLOVE) ×2 IMPLANT
GOWN STRL REUS W/ TWL XL LVL3 (GOWN DISPOSABLE) ×2 IMPLANT
GOWN STRL REUS W/TWL XL LVL3 (GOWN DISPOSABLE) ×2
HOLDER FOLEY CATH W/STRAP (MISCELLANEOUS) IMPLANT
LOOP CUT BIPOLAR 24F LRG (ELECTROSURGICAL) IMPLANT
MANIFOLD NEPTUNE II (INSTRUMENTS) ×2 IMPLANT
PACK CYSTO (CUSTOM PROCEDURE TRAY) ×2 IMPLANT
SYR 30ML LL (SYRINGE) ×2 IMPLANT
SYR TOOMEY IRRIG 70ML (MISCELLANEOUS) ×2
SYRINGE TOOMEY IRRIG 70ML (MISCELLANEOUS) ×2 IMPLANT
TUBING CONNECTING 10 (TUBING) ×2 IMPLANT
TUBING UROLOGY SET (TUBING) ×2 IMPLANT

## 2023-02-09 NOTE — Anesthesia Preprocedure Evaluation (Addendum)
Anesthesia Evaluation  Patient identified by MRN, date of birth, ID band Patient awake    Reviewed: Allergy & Precautions, NPO status , Patient's Chart, lab work & pertinent test results  Airway Mallampati: III  TM Distance: >3 FB Neck ROM: Full    Dental  (+) Teeth Intact, Dental Advisory Given   Pulmonary former smoker 30 pack year history, quit smoking 2000 Snores at night, has never had sleep study    Pulmonary exam normal breath sounds clear to auscultation       Cardiovascular hypertension (165/95 preop, no home meds- per pt normally lower when he sees PCP), Normal cardiovascular exam Rhythm:Regular Rate:Normal     Neuro/Psych negative neurological ROS  negative psych ROS   GI/Hepatic Neg liver ROS,GERD  Medicated and Controlled,,  Endo/Other  negative endocrine ROS    Renal/GU negative Renal ROS Bladder dysfunction      Musculoskeletal  (+) Arthritis , Osteoarthritis,    Abdominal   Peds  Hematology  (+) Blood dyscrasia, anemia Hb 11.9, plt 264   Anesthesia Other Findings   Reproductive/Obstetrics negative OB ROS                             Anesthesia Physical Anesthesia Plan  ASA: 2  Anesthesia Plan: General   Post-op Pain Management: Tylenol PO (pre-op)*   Induction: Intravenous  PONV Risk Score and Plan: 2 and Ondansetron, Dexamethasone and Treatment may vary due to age or medical condition  Airway Management Planned: LMA  Additional Equipment: None  Intra-op Plan:   Post-operative Plan: Extubation in OR  Informed Consent: I have reviewed the patients History and Physical, chart, labs and discussed the procedure including the risks, benefits and alternatives for the proposed anesthesia with the patient or authorized representative who has indicated his/her understanding and acceptance.     Dental advisory given  Plan Discussed with: CRNA  Anesthesia Plan  Comments:        Anesthesia Quick Evaluation

## 2023-02-09 NOTE — Anesthesia Postprocedure Evaluation (Signed)
Anesthesia Post Note  Patient: Cloise Cambareri  Procedure(s) Performed: CYSTOSCOPY TRANSURETHRAL RESECTION OF THE PROSTATE (TURP) (Bilateral)     Patient location during evaluation: PACU Anesthesia Type: General Level of consciousness: awake and alert, oriented and patient cooperative Pain management: pain level controlled Vital Signs Assessment: post-procedure vital signs reviewed and stable Respiratory status: spontaneous breathing, nonlabored ventilation and respiratory function stable Cardiovascular status: blood pressure returned to baseline and stable Postop Assessment: no apparent nausea or vomiting Anesthetic complications: no   No notable events documented.  Last Vitals:  Vitals:   02/09/23 1345 02/09/23 1400  BP: 133/82 122/70  Pulse: 66   Resp: 16 17  Temp:    SpO2: 100% 96%    Last Pain:  Vitals:   02/09/23 1325  TempSrc:   PainSc: 0-No pain                 Lannie Fields

## 2023-02-09 NOTE — Op Note (Signed)
OPERATIVE NOTE   Patient Name: Louis Campos  MRN: [id number]   Date of Procedure: 02/09/23  Preoperative diagnosis:  BPH with obstruction Prostate cancer History of urethral stricture  Postoperative diagnosis:  BPH with obstruction Prostate cancer History of urethral stricture  Procedure:  Cystoscopy Transurethral resection of prostate  Attending: Milderd Meager, MD  Anesthesia: General  Estimated blood loss: 25 ml  Fluids: Per anesthesia record  Drains: 63F 3 way foley to continuous bladder irrigation  Specimens:  Prostate chips  Antibiotics: Levaquin 500 mg IV  Findings: minimal narrowing of bulbar urethra at prior stricture site; lateral lobe enlargement of prostate with median lobe; bladder with trabeculations  Indications:  72 year old male with BPH, unfavorable intermediate risk prostate cancer and history of urethral stricture presents for surgical management of his BPH with obstruction.  He has a history of lower urinary tract symptoms as well as UTIs.  He was previously found to have a narrow bulbar urethral stricture and underwent balloon dilation with Optilume and May 2023.  He had recurrence of his stricture with increased lower urinary tract symptoms and DVIU and balloon dilation in May 2024.  Despite treatment of the stricture and medical management, he has continued to have significant lower urinary tract symptoms and elevated postvoid residuals.  He has recently been diagnosed with unfavorable intermediate risk prostate cancer and has elected to undergo treatment with radiation therapy.  Treatment options for his BPH with obstruction were discussed with the patient.  He has elected to proceed with transurethral resection of the prostate. Potential risk of TURP discussed including infection, bleeding, injury to the urethra, bladder, ureters, urethral stricture formation, retrograde ejaculation, regrowth of adenoma, urinary incontinence, and anesthetic risk  reviewed.  He understands and wishes to proceed as described.  Description of Procedure:  The patient received IV Levaquin preoperatively.  He was brought to the operating room and properly identified.  After successful induction of a general anesthetic, the patient was placed in the dorsolithotomy position.  The patient's genitalia was prepped draped in a sterile fashion.  The meatus was dilated to 30 [place] using [employer].  A 21 French rigid cystoscope was passed into the urethra.  The anterior urethra was normal.  Mild narrowing was noted in the bulbar urethra at the prior stricture site.  I was able to pass the scope through this area without difficulty.  The patient was found to have lateral lobe enlargement of the prostate with a median lobe.  Examination of the bladder demonstrated a normal-appearing trigone with a single orifice bilaterally.  Trabeculations were noted.  No mucosal lesions were seen.  The 26 French resectoscope sheath was then placed under direct visualization.  Using the [facility] resectoscope and the bipolar loop, transurethral resection of the prostate was performed.  Resection was carried out from the bladder neck to the verumontanum.  The median lobe was initially resected.  The lateral lobes were then resected all until all visible obstructing tissue was removed.  Care was taken to event any injury to the ureters as well as the sphincter during resection.  Hemostasis was obtained.  Resected prostate chips were irrigated from the bladder using a Urovac evacuator.  Inspection demonstrated no active bleeding.  The cystoscope was removed.  A 24 French three-way Foley catheter was placed with 30 mL of sterile water in the balloon.  The catheter irrigated easily.  The catheter was placed to continuous bladder irrigation.  The patient was then extubated and taken to the postanesthesia  care unit in stable condition.  Complications: None  Condition: Stable, extubated, transferred  to PACU  Plan:  Admit for observation Continuous bladder irrigation overnight

## 2023-02-09 NOTE — Interval H&P Note (Signed)
History and Physical Interval Note:  02/09/2023 11:26 AM  Louis Campos  has presented today for surgery, with the diagnosis of BPH  prostate cancer.  The various methods of treatment have been discussed with the patient and family. After consideration of risks, benefits and other options for treatment, the patient has consented to  Procedure(s): CYSTOSCOPY FLEXIBLE (Bilateral) TRANSURETHRAL RESECTION OF THE PROSTATE (TURP) (Bilateral) as a surgical intervention.  The patient's history has been reviewed, patient examined, no change in status, stable for surgery.  I have reviewed the patient's chart and labs.  Questions were answered to the patient's satisfaction.     Di Kindle

## 2023-02-09 NOTE — Anesthesia Procedure Notes (Signed)
Procedure Name: LMA Insertion Date/Time: 02/09/2023 12:12 PM  Performed by: Ludwig Lean, CRNAPre-anesthesia Checklist: Patient identified, Emergency Drugs available, Suction available and Patient being monitored Patient Re-evaluated:Patient Re-evaluated prior to induction Oxygen Delivery Method: Circle system utilized Preoxygenation: Pre-oxygenation with 100% oxygen Induction Type: IV induction Ventilation: Mask ventilation without difficulty LMA: LMA inserted LMA Size: 4.0 Number of attempts: 1 Placement Confirmation: positive ETCO2 and breath sounds checked- equal and bilateral Tube secured with: Tape Dental Injury: Teeth and Oropharynx as per pre-operative assessment

## 2023-02-09 NOTE — Transfer of Care (Signed)
Immediate Anesthesia Transfer of Care Note  Patient: Louis Campos  Procedure(s) Performed: CYSTOSCOPY TRANSURETHRAL RESECTION OF THE PROSTATE (TURP) (Bilateral)  Patient Location: PACU  Anesthesia Type:General  Level of Consciousness: awake and alert   Airway & Oxygen Therapy: Patient Spontanous Breathing and Patient connected to face mask oxygen  Post-op Assessment: Report given to RN and Post -op Vital signs reviewed and stable  Post vital signs: Reviewed and stable  Last Vitals:  Vitals Value Taken Time  BP 122/74 02/09/23 1325  Temp    Pulse 76 02/09/23 1327  Resp 15 02/09/23 1327  SpO2 100 % 02/09/23 1327  Vitals shown include unfiled device data.  Last Pain:  Vitals:   02/09/23 1005  TempSrc:   PainSc: 0-No pain         Complications: No notable events documented.

## 2023-02-10 ENCOUNTER — Encounter (HOSPITAL_COMMUNITY): Payer: Self-pay | Admitting: Urology

## 2023-02-10 DIAGNOSIS — C61 Malignant neoplasm of prostate: Secondary | ICD-10-CM | POA: Diagnosis not present

## 2023-02-10 MED ORDER — CHLORHEXIDINE GLUCONATE CLOTH 2 % EX PADS: 6.0000 | MEDICATED_PAD | Freq: Every day | CUTANEOUS | Status: DC

## 2023-02-10 MED ORDER — SENNOSIDES-DOCUSATE SODIUM 8.6-50 MG PO TABS: 2.0000 | ORAL_TABLET | Freq: Every day | ORAL | 0 refills | Status: DC

## 2023-02-10 NOTE — Discharge Summary (Signed)
Patient ID: Louis Campos MRN: [id number] DOB/AGE: [birth year] 72 y.o.  Admit date: 02/09/2023 Discharge date: 02/10/2023  Primary Care Physician:  Louis Alcide, MD  Discharge Diagnoses:   Present on Admission:  BPH with obstruction/lower urinary tract symptoms   Consults:  none   Discharge Medications: Allergies as of 02/10/2023   No Known Allergies      Medication List     TAKE these medications    ibuprofen 200 MG tablet Commonly known as: ADVIL Take 200-600 mg by mouth every 6 (six) hours as needed for headache or mild pain.   nitrofurantoin (macrocrystal-monohydrate) 100 MG capsule Commonly known as: MACROBID Take 1 capsule (100 mg total) by mouth daily.   pantoprazole 40 MG tablet Commonly known as: Protonix Take 1 tablet (40 mg total) by mouth 2 (two) times daily. What changed: when to take this   senna-docusate 8.6-50 MG tablet Commonly known as: Senokot-S Take 2 tablets by mouth at bedtime.   silodosin 8 MG Caps capsule Commonly known as: RAPAFLO Take 1 capsule (8 mg total) by mouth at bedtime.         Significant Diagnostic Studies:  No results found.  Brief H and P: For complete details please refer to admission H and P, but in brief 72 year old male with BPH with lower urinary tract symptoms, unfavorable intermediate risk prostate cancer, and history of urethral stricture presents for surgical management of his BPH with obstruction.  He has continued to have significant lower urinary tract symptoms despite treatment of his urethral stricture and May 2023 and again in May 2024 and medical therapy.  Cystoscopy previously demonstrated lateral lobe enlargement of the prostate with a median lobe.  Prostate volume measured 40 cm.  He presents now for transurethral resection of the prostate for management of his BPH in preparation for his radiation therapy.  Hospital Course:  The patient was admitted to the hospital on 02/09/2023.  He was taken to the  operating room where underwent cystoscopy and transurethral resection of the prostate.  Please see the operative note for complete details.  The patient's postoperative course was unremarkable as he remained afebrile and hemodynamically stable.  He was continued on bladder irrigation overnight.  This was discontinued on postoperative day #1.  His urine was light pink in color without clots.  He was tolerating regular diet and his pain was controlled.  He was felt to be stable for discharge home with his Foley catheter on postoperative day #1.  Day of Discharge BP 131/84 (BP Location: Left Arm)   Pulse 69   Temp 97.8 F (36.6 C) (Oral)   Resp 16   Ht 5\' 7"  (1.702 m)   Wt 73 kg   SpO2 100%   BMI 25.21 kg/m   No results found for this or any previous visit (from the past 24 hour(s)).  Physical Exam: General: Alert and awake oriented x3 not in any acute distress. HEENT: [place]/AT CVS: RRR Chest: clear to auscultation bilaterally Abdomen: soft nontender, nondistended, normal bowel sounds Extremities: no cyanosis, clubbing or edema noted bilaterally Neuro: Non-focal GU:  foley draining light pink urine without clots  Disposition:  D/C home with foley  Diet:  Regular  Activity:  no heavy lifting x 3 weeks   TESTS THAT NEED FOLLOW-UP   None  DISCHARGE FOLLOW-UP   Follow-up Information     Louis Campos, Louis Bad., MD Follow up on 02/11/2023.   Specialty: Urology Contact information: [address] [place]  [address] [phone]                 Time spent on Discharge:   15 minutes  Signed: Di Campos 02/10/2023, 8:57 AM

## 2023-02-10 NOTE — Progress Notes (Signed)
Patient received discharge orders to go home. Patient was given discharge paperwork/instructions. Another RN assisted primary RN with going over discharge instructions/paperwork with the patient prior to discharge. All questions/concerns were addressed during that time. Patient also received education on how to change standard foley bag to leg bag and vice versa, as well as foley care. Patient was given supplies: leg bags, standard foley bag, leg straps, and foley wipes. Patient left the hospital stable with foley catheter, had discharge paperwork/instructions, had supplies, and had all personal belongings.

## 2023-02-10 NOTE — Plan of Care (Signed)
Problem: Education: Goal: Knowledge of the prescribed therapeutic regimen will improve Outcome: Completed/Met   Problem: Bowel/Gastric: Goal: Gastrointestinal status for postoperative course will improve Outcome: Completed/Met   Problem: Health Behavior/Discharge Planning: Goal: Identification of resources available to assist in meeting health care needs will improve Outcome: Completed/Met   Problem: Skin Integrity: Goal: Demonstration of wound healing without infection will improve Outcome: Completed/Met   Problem: Urinary Elimination: Goal: Ability to avoid or minimize complications of infection will improve Outcome: Completed/Met   Problem: Education: Goal: Knowledge of General Education information will improve Description: Including pain rating scale, medication(s)/side effects and non-pharmacologic comfort measures Outcome: Completed/Met   Problem: Health Behavior/Discharge Planning: Goal: Ability to manage health-related needs will improve Outcome: Completed/Met   Problem: Clinical Measurements: Goal: Ability to maintain clinical measurements within normal limits will improve Outcome: Completed/Met Goal: Will remain free from infection Outcome: Completed/Met Goal: Diagnostic test results will improve Outcome: Completed/Met Goal: Respiratory complications will improve Outcome: Completed/Met Goal: Cardiovascular complication will be avoided Outcome: Completed/Met   Problem: Activity: Goal: Risk for activity intolerance will decrease Outcome: Completed/Met   Problem: Nutrition: Goal: Adequate nutrition will be maintained Outcome: Completed/Met   Problem: Coping: Goal: Level of anxiety will decrease Outcome: Completed/Met   Problem: Elimination: Goal: Will not experience complications related to bowel motility Outcome: Completed/Met Goal: Will not experience complications related to urinary retention Outcome: Completed/Met   Problem: Pain  Managment: Goal: General experience of comfort will improve Outcome: Completed/Met   Problem: Safety: Goal: Ability to remain free from injury will improve Outcome: Completed/Met   Problem: Skin Integrity: Goal: Risk for impaired skin integrity will decrease Outcome: Completed/Met

## 2023-02-11 ENCOUNTER — Ambulatory Visit: Payer: Medicare HMO | Admitting: Urology

## 2023-02-11 ENCOUNTER — Encounter: Payer: Self-pay | Admitting: Urology

## 2023-02-11 VITALS — BP 154/80 | HR 61

## 2023-02-11 DIAGNOSIS — C61 Malignant neoplasm of prostate: Secondary | ICD-10-CM

## 2023-02-11 DIAGNOSIS — Z87448 Personal history of other diseases of urinary system: Secondary | ICD-10-CM

## 2023-02-11 DIAGNOSIS — N35912 Unspecified bulbous urethral stricture, male: Secondary | ICD-10-CM

## 2023-02-11 DIAGNOSIS — N401 Enlarged prostate with lower urinary tract symptoms: Secondary | ICD-10-CM

## 2023-02-11 DIAGNOSIS — N138 Other obstructive and reflux uropathy: Secondary | ICD-10-CM

## 2023-02-11 DIAGNOSIS — Z87440 Personal history of urinary (tract) infections: Secondary | ICD-10-CM | POA: Diagnosis not present

## 2023-02-11 NOTE — Progress Notes (Signed)
Fill and Pull Catheter Removal  Patient is present today for a catheter removal.  Patient was cleaned and prepped in a sterile fashion of sterile water was instilled into the bladder when the patient felt the urge to urinate, 30mL of water was then drained from the balloon.  A 24FR foley cath was removed from the bladder no complications were noted .  Patient as then given some time to void on their own.  Patient can void  on their own after some time.  Patient tolerated well.  Performed by: Arville Go CMA

## 2023-02-11 NOTE — Progress Notes (Signed)
Assessment: 1. BPH with obstruction/lower urinary tract symptoms   2. Prostate cancer (HCC);  PSA 4.5; GG 3; unfavorable intermediate risk   3. 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 History of UTI      Plan: Continue daily Macrobid for UTI prevention. Continue silodosin 8 mg daily. Foley removed today following successful voiding trial Post operative restrictions discussed Return to office in 2 weeks.  Chief Complaint:  Chief Complaint  Patient presents with   Voiding trial    History of Present Illness:  Louis Campos is a 72 y.o. year old male who is seen for further evaluation of BPH with obstruction, unfavorable intermediate risk prostate cancer, history of urethral stricture, and history of UTIs. He has a history of UTIs approximately once a year for several years beginning in 2014.  These resolved spontaneously.  About 1 year ago,  he noted foul-smelling and cloudy urine.  He was tested for UTI and by report his culture was negative.  He continued to have foul-smelling and cloudy urine.  He also reported urinary urgency at times, slight discomfort with voiding, weak stream, and nocturia 1-2 times. No gross hematuria or flank pain. IPSS = 13.  PVR = 350 mL. CT renal stone study from 07/11/2021 showed a small nonobstructing right renal stone, small calculus in the bladder, no evidence of renal mass or obstruction. Urine culture from 2/23 grew 25-50K staph haemolyticus. Treated with macrobid.  At his return visit in March 2023, he continued on alfuzosin.  He noted an improvement in the color and odor of his urine.  He continued to have a decreased stream, frequency, and sensation of incomplete emptying.  No dysuria or gross hematuria. IPSS = 15.  PVR = 330 ml. He was changed to silodosin 8 mg daily.  He continued on silodosin without significant change in his urinary symptoms.  He continued to have a weak stream, sensation of incomplete emptying,  frequency and urgency. IPSS = 17. Cystoscopy demonstrated a narrow bulbar urethral stricture which would not allow passage of the cystoscope.  He underwent balloon dilation of the urethral stricture on 10/08/2021.  A OptiLume balloon was used.  He was discharged with a catheter in place. He presented for a voiding trial on 10/13/2021.  He noted gross hematuria beginning 10/12/2021.[name]   His catheter was removed but he was unable to void and returned later that afternoon in retention.  A Foley catheter was placed with return of 750 mL of dark bloody urine.  The catheter was irrigated with return of multiple small clots. His catheter was removed on 10/16/2021 after a successful voiding trial.  At his visit in 6/23, he was voiding spontaneously after foley removal. No gross hematuria.  No dysuria.  He continued with some frequency and urgency.  No significant change in his stream. PVR = 279 ml.  At his visit in 7/23, he continued to report frequency, decreased force of stream, and sensation of incomplete emptying.  His symptoms were primarily in the morning.  He had nocturia 0-1 time per night.  No dysuria or gross hematuria.  He continues on silodosin. IPSS = 14. PVR = 267 ml  He was seen by Dr. Annabell Howells in January 2024 following a UTI.  He had a flow rate and PVR in February 2024 which showed a peak flow of 4 mL/s.  Cystoscopy in April 2024 showed a normal anterior urethra but a short segment moderate to severe bulbar stricture that would  not allow passage of the scope. Pelvic ultrasound from 09/08/2022 showed a postvoid volume of 371 mL.  No stones were seen within the bladder. PSA from 08/27/2022 was 4.5 with free PSA of 19.1%.   He continued to have lower urinary tract symptoms including weak stream, sensation of incomplete emptying, and urinary frequency.  No dysuria or gross hematuria.  No UTI symptoms.   IPSS = 17.  He underwent cystoscopy, DVIU, and balloon dilation of a bulbar urethral stricture on  10/14/2022.  He was discharged with a Foley catheter in place.   His Foley was removed on 10/16/2022 after a successful voiding trial.  At his visit in June 2024, he continued on daily Macrobid and Rapaflo. He continued to have lower urinary tract symptoms with decreased force of stream, sensation of incomplete emptying, and frequency.  No dysuria or gross hematuria. PVR = 326 ml  ExoDX score elevated at 29.53.  He is s/p transrectal ultrasound and biopsy of the prostate on 12/08/22. PSA: 4.5 ng/ml TRUS volume:  40 ml  PSA density:  0.11 Biopsy results:             Gleason score: 4+ 3 = 7            # positive cores: 1/6 on right    0/6 on left            Location of cancer: mid gland  Complications after biopsy: none Patient with significant LUTS.    IPSS = 16 Patient without erectile dysfunction.   PSA May PET scan from 12/27/2022 showed uptake in the right side of the prostate without evidence of metastatic disease.  Treatment options were discussed with the patient.  He has elected to proceed with IMRT. Management of his BPH with lower urinary tract symptoms prior to initiation of radiation therapy has been recommended.  Options discussed.  He is elected to proceed with a transurethral resection of the prostate.  He is status post a TURP on 02/09/2023.  He did well postoperatively and was discharged home with a Foley catheter on postop day #1. Path showed benign prostatic hyperplasia. He presents today for a voiding trial.  His catheter has been draining well.  No gross hematuria.  He continues on daily Macrobid and silodosin.  Portions of the above documentation were copied from a prior visit for review purposes only.  Past Medical History:  Past Medical History:  Diagnosis Date   Acid reflux    patient denies   Arthritis    Cancer (HCC)    Prostate cancer   Hypertension    Pneumonia     Past Surgical History:  Past Surgical History:  Procedure Laterality Date   BALLOON  DILATION Bilateral 10/13/2022   Procedure: BALLOON DILATION OF URETHRAL STRICTURE;  Surgeon: Milderd Meager., MD;  Location: [facility];  Service: Urology;  Laterality: Bilateral;   BIOPSY  09/09/2021   Procedure: BIOPSY;  Surgeon: Lanelle Bal, DO;  [facility]: [facility];  Service: Endoscopy;;   CYSTOSCOPY N/A 02/09/2023   Procedure: CYSTOSCOPY;  Surgeon: Milderd Meager., MD;  Location: [facility];  Service: Urology;  Laterality: N/A;   CYSTOSCOPY WITH URETHRAL DILATATION N/A 10/08/2021   Procedure: CYSTOSCOPY WITH URETHRAL DILATATION- balloon dilatation with OPTILUME;  Surgeon: Milderd Meager., MD;  Location: [facility];  Service: Urology;  Laterality: N/A;   CYSTOSCOPY WITH URETHRAL DILATATION Bilateral 10/13/2022   Procedure: CYSTOSCOPY WITH DIRECT VISUAL INTERNAL URETHROTOMY, URETHRAL DILATATION;  Surgeon: Milderd Meager.,  MD;  Location: [facility];  Service: Urology;  Laterality: Bilateral;   ESOPHAGEAL BRUSHING  09/09/2021   Procedure: ESOPHAGEAL BRUSHING;  Surgeon: Lanelle Bal, DO;  [facility]: [facility];  Service: Endoscopy;;   ESOPHAGOGASTRODUODENOSCOPY (EGD) WITH PROPOFOL N/A 09/09/2021   Procedure: ESOPHAGOGASTRODUODENOSCOPY (EGD) WITH PROPOFOL;  Surgeon: Lanelle Bal, DO;  [facility]: [facility];  Service: Endoscopy;  Laterality: N/A;  2:15pm   EYE SURGERY Bilateral 1990   radial keratotomy   HAND SURGERY     right   PROSTATE BIOPSY     TRANSURETHRAL RESECTION OF PROSTATE Bilateral 02/09/2023   Procedure: TRANSURETHRAL RESECTION OF THE PROSTATE (TURP);  Surgeon: Milderd Meager., MD;  Location: [facility];  Service: Urology;  Laterality: Bilateral;    Allergies:  No Known Allergies  Family History:  Family History  Problem Relation Age of Onset   Colon cancer Neg Hx    Esophageal cancer Neg Hx     Social History:  Social History   Tobacco Use   Smoking status: Former    Current packs/day: 0.00    Average packs/day: 1.5 packs/day for 20.0  years (30.0 ttl pk-yrs)    Types: Cigarettes    Start date: 32    Quit date: 2000    Years since quitting: 24.7    Passive exposure: Past   Smokeless tobacco: Never  Vaping Use   Vaping status: Never Used  Substance Use Topics   Alcohol use: Yes    Alcohol/week: 1.0 standard drink of alcohol    Types: 1 Cans of beer per week    Comment: couple of shots every week.   Drug use: Not Currently    ROS: Constitutional:  Negative for fever, chills, weight loss CV: Negative for chest pain, previous MI, hypertension Respiratory:  Negative for shortness of breath, wheezing, sleep apnea, frequent cough GI:  Negative for nausea, vomiting, bloody stool, GERD  Physical exam: BP (!) 154/80   Pulse 61  GENERAL APPEARANCE:  Well appearing, well developed, well nourished, NAD HEENT:  Atraumatic, normocephalic, oropharynx clear NECK:  Supple without lymphadenopathy or thyromegaly ABDOMEN:  Soft, non-tender, no masses EXTREMITIES:  Moves all extremities well, without clubbing, cyanosis, or edema NEUROLOGIC:  Alert and oriented x 3, normal gait, CN II-XII grossly intact MENTAL STATUS:  appropriate BACK:  Non-tender to palpation, No CVAT SKIN:  Warm, dry, and intact GU:  foley draining clear urine  Results: None  Procedure:  VOIDING TRIAL  A voiding trial was performed in the office today.   Volume of sterile water instilled: 220 mL Foley catheter removed intact. Volume voided by patient: 220 mL Instructed to return to office if has not voided by 4 PM

## 2023-02-24 ENCOUNTER — Encounter: Payer: Self-pay | Admitting: Urology

## 2023-02-24 ENCOUNTER — Ambulatory Visit (INDEPENDENT_AMBULATORY_CARE_PROVIDER_SITE_OTHER): Payer: Medicare HMO | Admitting: Urology

## 2023-02-24 VITALS — BP 148/79 | HR 67

## 2023-02-24 DIAGNOSIS — Z87448 Personal history of other diseases of urinary system: Secondary | ICD-10-CM

## 2023-02-24 DIAGNOSIS — Z09 Encounter for follow-up examination after completed treatment for conditions other than malignant neoplasm: Secondary | ICD-10-CM

## 2023-02-24 DIAGNOSIS — C61 Malignant neoplasm of prostate: Secondary | ICD-10-CM

## 2023-02-24 DIAGNOSIS — R339 Retention of urine, unspecified: Secondary | ICD-10-CM | POA: Diagnosis not present

## 2023-02-24 DIAGNOSIS — N138 Other obstructive and reflux uropathy: Secondary | ICD-10-CM

## 2023-02-24 DIAGNOSIS — Z87438 Personal history of other diseases of male genital organs: Secondary | ICD-10-CM | POA: Diagnosis not present

## 2023-02-24 DIAGNOSIS — N401 Enlarged prostate with lower urinary tract symptoms: Secondary | ICD-10-CM | POA: Diagnosis not present

## 2023-02-24 DIAGNOSIS — N35912 Unspecified bulbous urethral stricture, male: Secondary | ICD-10-CM

## 2023-02-24 DIAGNOSIS — Z87440 Personal history of urinary (tract) infections: Secondary | ICD-10-CM | POA: Diagnosis not present

## 2023-02-24 LAB — URINALYSIS, ROUTINE W REFLEX MICROSCOPIC
Bilirubin, UA: NEGATIVE
Glucose, UA: NEGATIVE
Ketones, UA: NEGATIVE
Nitrite, UA: NEGATIVE
Specific Gravity, UA: 1.01 (ref 1.005–1.030)
Urobilinogen, Ur: 0.2 mg/dL (ref 0.2–1.0)
pH, UA: 6 (ref 5.0–7.5)

## 2023-02-24 LAB — MICROSCOPIC EXAMINATION

## 2023-02-24 LAB — BLADDER SCAN AMB NON-IMAGING

## 2023-02-24 NOTE — Progress Notes (Signed)
Assessment: 1. BPH with obstruction/lower urinary tract symptoms   2. Prostate cancer (HCC);  PSA 4.5; GG 3; unfavorable intermediate risk   3. 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 History of UTI     Plan: He is currently doing well 2 weeks s/p TURP Continue daily Macrobid for UTI prevention. Continue silodosin 8 mg daily. Return to office in 1 month Discussed plan for beginning radiation therapy around 8 weeks post-op  Chief Complaint:  Chief Complaint  Patient presents with   Benign Prostatic Hypertrophy    History of Present Illness:  Louis Campos is a 72 y.o. year old male who is seen for further evaluation of BPH with obstruction, unfavorable intermediate risk prostate cancer, history of urethral stricture, and history of UTIs. He has a history of UTIs approximately once a year for several years beginning in 2014.  These resolved spontaneously.  About 1 year ago,  he noted foul-smelling and cloudy urine.  He was tested for UTI and by report his culture was negative.  He continued to have foul-smelling and cloudy urine.  He also reported urinary urgency at times, slight discomfort with voiding, weak stream, and nocturia 1-2 times. No gross hematuria or flank pain. IPSS = 13.  PVR = 350 mL. CT renal stone study from 07/11/2021 showed a small nonobstructing right renal stone, small calculus in the bladder, no evidence of renal mass or obstruction. Urine culture from 2/23 grew 25-50K staph haemolyticus. Treated with macrobid.  At his return visit in March 2023, he continued on alfuzosin.  He noted an improvement in the color and odor of his urine.  He continued to have a decreased stream, frequency, and sensation of incomplete emptying.  No dysuria or gross hematuria. IPSS = 15.  PVR = 330 ml. He was changed to silodosin 8 mg daily.  He continued on silodosin without significant change in his urinary symptoms.  He continued to have a weak stream,  sensation of incomplete emptying, frequency and urgency. IPSS = 17. Cystoscopy demonstrated a narrow bulbar urethral stricture which would not allow passage of the cystoscope.  He underwent balloon dilation of the urethral stricture on 10/08/2021.  A OptiLume balloon was used.  He was discharged with a catheter in place. He presented for a voiding trial on 10/13/2021.  He noted gross hematuria beginning 10/12/2021.[name]   His catheter was removed but he was unable to void and returned later that afternoon in retention.  A Foley catheter was placed with return of 750 mL of dark bloody urine.  The catheter was irrigated with return of multiple small clots. His catheter was removed on 10/16/2021 after a successful voiding trial.  At his visit in 6/23, he was voiding spontaneously after foley removal. No gross hematuria.  No dysuria.  He continued with some frequency and urgency.  No significant change in his stream. PVR = 279 ml.  At his visit in 7/23, he continued to report frequency, decreased force of stream, and sensation of incomplete emptying.  His symptoms were primarily in the morning.  He had nocturia 0-1 time per night.  No dysuria or gross hematuria.  He continues on silodosin. IPSS = 14. PVR = 267 ml  He was seen by Dr. Annabell Howells in January 2024 following a UTI.  He had a flow rate and PVR in February 2024 which showed a peak flow of 4 mL/s.  Cystoscopy in April 2024 showed a normal anterior urethra but a short  segment moderate to severe bulbar stricture that would not allow passage of the scope. Pelvic ultrasound from 09/08/2022 showed a postvoid volume of 371 mL.  No stones were seen within the bladder. PSA from 08/27/2022 was 4.5 with free PSA of 19.1%.   He continued to have lower urinary tract symptoms including weak stream, sensation of incomplete emptying, and urinary frequency.  No dysuria or gross hematuria.  No UTI symptoms.   IPSS = 17.  He underwent cystoscopy, DVIU, and balloon dilation  of a bulbar urethral stricture on 10/14/2022.  He was discharged with a Foley catheter in place.   His Foley was removed on 10/16/2022 after a successful voiding trial.  At his visit in June 2024, he continued on daily Macrobid and Rapaflo. He continued to have lower urinary tract symptoms with decreased force of stream, sensation of incomplete emptying, and frequency.  No dysuria or gross hematuria. PVR = 326 ml  ExoDX score elevated at 29.53.  He is s/p transrectal ultrasound and biopsy of the prostate on 12/08/22. PSA: 4.5 ng/ml TRUS volume:  40 ml  PSA density:  0.11 Biopsy results:             Gleason score: 4+ 3 = 7            # positive cores: 1/6 on right    0/6 on left            Location of cancer: mid gland  Complications after biopsy: none Patient with significant LUTS.    IPSS = 16 Patient without erectile dysfunction.   PSA May PET scan from 12/27/2022 showed uptake in the right side of the prostate without evidence of metastatic disease.  Treatment options were discussed with the patient.  He has elected to proceed with IMRT. Management of his BPH with lower urinary tract symptoms prior to initiation of radiation therapy has been recommended.  Options discussed.  He is elected to proceed with a transurethral resection of the prostate.  He is status post a TURP on 02/09/2023.  He did well postoperatively and was discharged home with a Foley catheter on postop day #1. Path showed benign prostatic hyperplasia. His foley was removed on 02/11/23 after a successful voiding trial. He continued on silodosin and macrobid daily.  He returns today for follow-up.  He is doing well following removal of the catheter.  He is voiding with a good stream.  His frequency is improving.  He is not having any dysuria or gross hematuria.  He feels like he is emptying his bladder well.  No flank pain.  Portions of the above documentation were copied from a prior visit for review purposes  only.  Past Medical History:  Past Medical History:  Diagnosis Date   Acid reflux    patient denies   Arthritis    Cancer (HCC)    Prostate cancer   Hypertension    Pneumonia     Past Surgical History:  Past Surgical History:  Procedure Laterality Date   BALLOON DILATION Bilateral 10/13/2022   Procedure: BALLOON DILATION OF URETHRAL STRICTURE;  Surgeon: Milderd Meager., MD;  Location: [facility];  Service: Urology;  Laterality: Bilateral;   BIOPSY  09/09/2021   Procedure: BIOPSY;  Surgeon: Lanelle Bal, DO;  [facility];  Service: Endoscopy;;   CYSTOSCOPY N/A 02/09/2023   Procedure: CYSTOSCOPY;  Surgeon: Milderd Meager., MD;  Location: [facility];  Service: Urology;  Laterality: N/A;   CYSTOSCOPY WITH URETHRAL DILATATION  N/A 10/08/2021   Procedure: CYSTOSCOPY WITH URETHRAL DILATATION- balloon dilatation with OPTILUME;  Surgeon: Milderd Meager., MD;  Location: [facility];  Service: Urology;  Laterality: N/A;   CYSTOSCOPY WITH URETHRAL DILATATION Bilateral 10/13/2022   Procedure: CYSTOSCOPY WITH DIRECT VISUAL INTERNAL URETHROTOMY, URETHRAL DILATATION;  Surgeon: Milderd Meager., MD;  Location: [facility];  Service: Urology;  Laterality: Bilateral;   ESOPHAGEAL BRUSHING  09/09/2021   Procedure: ESOPHAGEAL BRUSHING;  Surgeon: Lanelle Bal, DO;  [facility];  Service: Endoscopy;;   ESOPHAGOGASTRODUODENOSCOPY (EGD) WITH PROPOFOL N/A 09/09/2021   Procedure: ESOPHAGOGASTRODUODENOSCOPY (EGD) WITH PROPOFOL;  Surgeon: Lanelle Bal, DO;  [facility];  Service: Endoscopy;  Laterality: N/A;  2:15pm   EYE SURGERY Bilateral 1990   radial keratotomy   HAND SURGERY     right   PROSTATE BIOPSY     TRANSURETHRAL RESECTION OF PROSTATE Bilateral 02/09/2023   Procedure: TRANSURETHRAL RESECTION OF THE PROSTATE (TURP);  Surgeon: Milderd Meager., MD;  Location: [facility];  Service: Urology;  Laterality: Bilateral;    Allergies:  No Known  Allergies  Family History:  Family History  Problem Relation Age of Onset   Colon cancer Neg Hx    Esophageal cancer Neg Hx     Social History:  Social History   Tobacco Use   Smoking status: Former    Current packs/day: 0.00    Average packs/day: 1.5 packs/day for 20.0 years (30.0 ttl pk-yrs)    Types: Cigarettes    Start date: 64    Quit date: 2000    Years since quitting: 24.7    Passive exposure: Past   Smokeless tobacco: Never  Vaping Use   Vaping status: Never Used  Substance Use Topics   Alcohol use: Yes    Alcohol/week: 1.0 standard drink of alcohol    Types: 1 Cans of beer per week    Comment: couple of shots every week.   Drug use: Not Currently    ROS: Constitutional:  Negative for fever, chills, weight loss CV: Negative for chest pain, previous MI, hypertension Respiratory:  Negative for shortness of breath, wheezing, sleep apnea, frequent cough GI:  Negative for nausea, vomiting, bloody stool, GERD  Physical exam: BP (!) 148/79   Pulse 67  GENERAL APPEARANCE:  Well appearing, well developed, well nourished, NAD HEENT:  Atraumatic, normocephalic, oropharynx clear NECK:  Supple without lymphadenopathy or thyromegaly ABDOMEN:  Soft, non-tender, no masses EXTREMITIES:  Moves all extremities well, without clubbing, cyanosis, or edema NEUROLOGIC:  Alert and oriented x 3, normal gait, CN II-XII grossly intact MENTAL STATUS:  appropriate BACK:  Non-tender to palpation, No CVAT SKIN:  Warm, dry, and intact  Results: U/A: 6-10 WBC, 3-10 RBC, few bacteria  PVR: 47 ml

## 2023-03-02 ENCOUNTER — Telehealth: Payer: Self-pay | Admitting: *Deleted

## 2023-03-02 NOTE — Telephone Encounter (Signed)
THIS PATIENT HAD AN APPT.ON 01/20/23 WITH DR.MORRIS FOR CONSULTATION AND WILL BE HAVING TREATMENT IN [place]

## 2023-03-30 DIAGNOSIS — C61 Malignant neoplasm of prostate: Secondary | ICD-10-CM | POA: Diagnosis not present

## 2023-03-31 ENCOUNTER — Encounter: Payer: Self-pay | Admitting: Urology

## 2023-03-31 ENCOUNTER — Ambulatory Visit: Payer: Medicare HMO | Admitting: Urology

## 2023-03-31 VITALS — BP 155/77 | HR 66 | Ht 67.0 in | Wt 152.0 lb

## 2023-03-31 DIAGNOSIS — Z1211 Encounter for screening for malignant neoplasm of colon: Secondary | ICD-10-CM | POA: Diagnosis not present

## 2023-03-31 DIAGNOSIS — Z87438 Personal history of other diseases of male genital organs: Secondary | ICD-10-CM

## 2023-03-31 DIAGNOSIS — R8290 Unspecified abnormal findings in urine: Secondary | ICD-10-CM | POA: Diagnosis not present

## 2023-03-31 DIAGNOSIS — N138 Other obstructive and reflux uropathy: Secondary | ICD-10-CM

## 2023-03-31 DIAGNOSIS — N35912 Unspecified bulbous urethral stricture, male: Secondary | ICD-10-CM | POA: Diagnosis not present

## 2023-03-31 DIAGNOSIS — N401 Enlarged prostate with lower urinary tract symptoms: Secondary | ICD-10-CM | POA: Diagnosis not present

## 2023-03-31 DIAGNOSIS — Z87440 Personal history of urinary (tract) infections: Secondary | ICD-10-CM

## 2023-03-31 DIAGNOSIS — C61 Malignant neoplasm of prostate: Secondary | ICD-10-CM

## 2023-03-31 LAB — BLADDER SCAN AMB NON-IMAGING

## 2023-03-31 NOTE — Progress Notes (Signed)
Assessment: 1. BPH with obstruction/lower urinary tract symptoms   2. Prostate cancer (HCC);  PSA 4.5; GG 3; unfavorable intermediate risk   3. 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 History of UTI    Plan: Urine culture sent today - will contact him with results Continue daily Macrobid for UTI prevention. Trial off of silodosin I again discussed the role of androgen deprivation therapy in conjunction with radiation therapy for management of unfavorable intermediate risk prostate cancer.  Potential side effects of ADT discussed.  He does not wish to pursue androgen deprivation therapy at this time. Return to office in 3 months.   Chief Complaint:  Chief Complaint  Patient presents with   Benign Prostatic Hypertrophy    History of Present Illness:  Louis Campos is a 72 y.o. year old male who is seen for further evaluation of BPH with obstruction, unfavorable intermediate risk prostate cancer, history of urethral stricture, and history of UTIs. He has a history of UTIs approximately once a year for several years beginning in 2014.  These resolved spontaneously.  About 1 year ago,  he noted foul-smelling and cloudy urine.  He was tested for UTI and by report his culture was negative.  He continued to have foul-smelling and cloudy urine.  He also reported urinary urgency at times, slight discomfort with voiding, weak stream, and nocturia 1-2 times. No gross hematuria or flank pain. IPSS = 13.  PVR = 350 mL. CT renal stone study from 07/11/2021 showed a small nonobstructing right renal stone, small calculus in the bladder, no evidence of renal mass or obstruction. Urine culture from 2/23 grew 25-50K staph haemolyticus. Treated with macrobid.  At his return visit in March 2023, he continued on alfuzosin.  He noted an improvement in the color and odor of his urine.  He continued to have a decreased stream, frequency, and sensation of incomplete emptying.  No  dysuria or gross hematuria. IPSS = 15.  PVR = 330 ml. He was changed to silodosin 8 mg daily.  He continued on silodosin without significant change in his urinary symptoms.  He continued to have a weak stream, sensation of incomplete emptying, frequency and urgency. IPSS = 17. Cystoscopy demonstrated a narrow bulbar urethral stricture which would not allow passage of the cystoscope.  He underwent balloon dilation of the urethral stricture on 10/08/2021.  A OptiLume balloon was used.  He was discharged with a catheter in place. He presented for a voiding trial on 10/13/2021.  He noted gross hematuria beginning 10/12/2021.[name]   His catheter was removed but he was unable to void and returned later that afternoon in retention.  A Foley catheter was placed with return of 750 mL of dark bloody urine.  The catheter was irrigated with return of multiple small clots. His catheter was removed on 10/16/2021 after a successful voiding trial.  At his visit in 6/23, he was voiding spontaneously after foley removal. No gross hematuria.  No dysuria.  He continued with some frequency and urgency.  No significant change in his stream. PVR = 279 ml.  At his visit in 7/23, he continued to report frequency, decreased force of stream, and sensation of incomplete emptying.  His symptoms were primarily in the morning.  He had nocturia 0-1 time per night.  No dysuria or gross hematuria.  He continues on silodosin. IPSS = 14. PVR = 267 ml  He was seen by Dr. Annabell Howells in January 2024 following a UTI.  He had a flow rate and PVR in February 2024 which showed a peak flow of 4 mL/s.  Cystoscopy in April 2024 showed a normal anterior urethra but a short segment moderate to severe bulbar stricture that would not allow passage of the scope. Pelvic ultrasound from 09/08/2022 showed a postvoid volume of 371 mL.  No stones were seen within the bladder. PSA from 08/27/2022 was 4.5 with free PSA of 19.1%.   He continued to have lower urinary  tract symptoms including weak stream, sensation of incomplete emptying, and urinary frequency.  No dysuria or gross hematuria.  No UTI symptoms.   IPSS = 17.  He underwent cystoscopy, DVIU, and balloon dilation of a bulbar urethral stricture on 10/14/2022.  He was discharged with a Foley catheter in place.   His Foley was removed on 10/16/2022 after a successful voiding trial.  At his visit in June 2024, he continued on daily Macrobid and Rapaflo. He continued to have lower urinary tract symptoms with decreased force of stream, sensation of incomplete emptying, and frequency.  No dysuria or gross hematuria. PVR = 326 ml  ExoDX score elevated at 29.53.  He is s/p transrectal ultrasound and biopsy of the prostate on 12/08/22. PSA: 4.5 ng/ml TRUS volume:  40 ml  PSA density:  0.11 Biopsy results:             Gleason score: 4+ 3 = 7            # positive cores: 1/6 on right    0/6 on left            Location of cancer: mid gland  Complications after biopsy: none Patient with significant LUTS.    IPSS = 16 Patient without erectile dysfunction.   PSA May PET scan from 12/27/2022 showed uptake in the right side of the prostate without evidence of metastatic disease.  Treatment options were discussed with the patient.  He has elected to proceed with IMRT. Management of his BPH with lower urinary tract symptoms prior to initiation of radiation therapy has been recommended.  Options discussed.  He is elected to proceed with a transurethral resection of the prostate.  He is status post a TURP on 02/09/2023.  He did well postoperatively and was discharged home with a Foley catheter on postop day #1. Path showed benign prostatic hyperplasia. His foley was removed on 02/11/23 after a successful voiding trial. He continued on silodosin and macrobid daily.  He was doing well following removal of the catheter and was voiding with a good stream.  His frequency was improving.  No dysuria or gross hematuria.    PVR = 47 ml  He returns today for follow-up.  He continues to do very well from a urinary standpoint.  He reports a good stream and feels like he is emptying his bladder well.  He is not having any frequency, urgency, or dysuria.  No gross hematuria.  He continues on silodosin and daily Macrobid. IPSS = 2. He has followed up with Dr. Langston Masker with radiation oncology and is scheduled to begin radiation therapy at approximately 2 weeks.  Portions of the above documentation were copied from a prior visit for review purposes only.  Past Medical History:  Past Medical History:  Diagnosis Date   Acid reflux    patient denies   Arthritis    Cancer [facility])    Prostate cancer   Hypertension    Pneumonia     Past Surgical History:  Past  Surgical History:  Procedure Laterality Date   BALLOON DILATION Bilateral 10/13/2022   Procedure: BALLOON DILATION OF URETHRAL STRICTURE;  Surgeon: Milderd Meager., MD;  [facility]: [facility];  Service: Urology;  Laterality: Bilateral;   BIOPSY  09/09/2021   Procedure: BIOPSY;  Surgeon: Lanelle Bal, DO;  [facility];  Service: Endoscopy;;   CYSTOSCOPY N/A 02/09/2023   Procedure: CYSTOSCOPY;  Surgeon: Milderd Meager., MD;  [facility];  Service: Urology;  Laterality: N/A;   CYSTOSCOPY WITH URETHRAL DILATATION N/A 10/08/2021   Procedure: CYSTOSCOPY WITH URETHRAL DILATATION- balloon dilatation with OPTILUME;  Surgeon: Milderd Meager., MD;  Location: [facility];  Service: Urology;  Laterality: N/A;   CYSTOSCOPY WITH URETHRAL DILATATION Bilateral 10/13/2022   Procedure: CYSTOSCOPY WITH DIRECT VISUAL INTERNAL URETHROTOMY, URETHRAL DILATATION;  Surgeon: Milderd Meager., MD;  [facility];  Service: Urology;  Laterality: Bilateral;   ESOPHAGEAL BRUSHING  09/09/2021   Procedure: ESOPHAGEAL BRUSHING;  Surgeon: Lanelle Bal, DO;  [facility];  Service: Endoscopy;;   ESOPHAGOGASTRODUODENOSCOPY (EGD) WITH PROPOFOL  N/A 09/09/2021   Procedure: ESOPHAGOGASTRODUODENOSCOPY (EGD) WITH PROPOFOL;  Surgeon: Lanelle Bal, DO;  [facility];  Service: Endoscopy;  Laterality: N/A;  2:15pm   EYE SURGERY Bilateral 1990   radial keratotomy   HAND SURGERY     right   PROSTATE BIOPSY     TRANSURETHRAL RESECTION OF PROSTATE Bilateral 02/09/2023   Procedure: TRANSURETHRAL RESECTION OF THE PROSTATE (TURP);  Surgeon: Milderd Meager., MD;  [facility];  Service: Urology;  Laterality: Bilateral;    Allergies:  No Known Allergies  Family History:  Family History  Problem Relation Age of Onset   Colon cancer Neg Hx    Esophageal cancer Neg Hx     Social History:  Social History   Tobacco Use   Smoking status: Former    Current packs/day: 0.00    Average packs/day: 1.5 packs/day for 20.0 years (30.0 ttl pk-yrs)    Types: Cigarettes    Start date: 68    Quit date: 2000    Years since quitting: 24.8    Passive exposure: Past   Smokeless tobacco: Never  Vaping Use   Vaping status: Never Used  Substance Use Topics   Alcohol use: Yes    Alcohol/week: 1.0 standard drink of alcohol    Types: 1 Cans of beer per week    Comment: couple of shots every week.   Drug use: Not Currently    ROS: Constitutional:  Negative for fever, chills, weight loss CV: Negative for chest pain, previous MI, hypertension Respiratory:  Negative for shortness of breath, wheezing, sleep apnea, frequent cough GI:  Negative for nausea, vomiting, bloody stool, GERD  Physical exam: BP (!) 155/77   Pulse 66   Ht 5\' 7"  (1.702 m)   Wt 152 lb (68.9 kg)   BMI 23.81 kg/m  GENERAL APPEARANCE:  Well appearing, well developed, well nourished, NAD HEENT:  Atraumatic, normocephalic, oropharynx clear NECK:  Supple without lymphadenopathy or thyromegaly ABDOMEN:  Soft, non-tender, no masses EXTREMITIES:  Moves all extremities well, without clubbing, cyanosis, or edema NEUROLOGIC:  Alert and oriented x 3, normal  gait, CN II-XII grossly intact MENTAL STATUS:  appropriate BACK:  Non-tender to palpation, No CVAT SKIN:  Warm, dry, and intact  Results: U/A: >30 WBC, 3-10 RBC, mod bacteria  PVR = 78 ml

## 2023-04-01 LAB — URINALYSIS, ROUTINE W REFLEX MICROSCOPIC
Bilirubin, UA: NEGATIVE
Glucose, UA: NEGATIVE
Ketones, UA: NEGATIVE
Nitrite, UA: NEGATIVE
Specific Gravity, UA: 1.025 (ref 1.005–1.030)
Urobilinogen, Ur: 0.2 mg/dL (ref 0.2–1.0)
pH, UA: 5.5 (ref 5.0–7.5)

## 2023-04-01 LAB — MICROSCOPIC EXAMINATION: WBC, UA: >30 /[HPF] — ABNORMAL HIGH (ref 0–5)

## 2023-04-02 LAB — URINE CULTURE: Organism ID, Bacteria: NO GROWTH

## 2023-04-03 ENCOUNTER — Encounter: Payer: Self-pay | Admitting: Urology

## 2023-04-12 DIAGNOSIS — C61 Malignant neoplasm of prostate: Secondary | ICD-10-CM | POA: Diagnosis not present

## 2023-04-12 DIAGNOSIS — Z1211 Encounter for screening for malignant neoplasm of colon: Secondary | ICD-10-CM | POA: Diagnosis not present

## 2023-04-14 DIAGNOSIS — C61 Malignant neoplasm of prostate: Secondary | ICD-10-CM | POA: Diagnosis not present

## 2023-04-14 DIAGNOSIS — Z1211 Encounter for screening for malignant neoplasm of colon: Secondary | ICD-10-CM | POA: Diagnosis not present

## 2023-04-15 DIAGNOSIS — C61 Malignant neoplasm of prostate: Secondary | ICD-10-CM | POA: Diagnosis not present

## 2023-04-15 DIAGNOSIS — Z1211 Encounter for screening for malignant neoplasm of colon: Secondary | ICD-10-CM | POA: Diagnosis not present

## 2023-04-16 DIAGNOSIS — C61 Malignant neoplasm of prostate: Secondary | ICD-10-CM | POA: Diagnosis not present

## 2023-04-16 DIAGNOSIS — Z1211 Encounter for screening for malignant neoplasm of colon: Secondary | ICD-10-CM | POA: Diagnosis not present

## 2023-04-19 DIAGNOSIS — Z1211 Encounter for screening for malignant neoplasm of colon: Secondary | ICD-10-CM | POA: Diagnosis not present

## 2023-04-19 DIAGNOSIS — C61 Malignant neoplasm of prostate: Secondary | ICD-10-CM | POA: Diagnosis not present

## 2023-04-20 DIAGNOSIS — C61 Malignant neoplasm of prostate: Secondary | ICD-10-CM | POA: Diagnosis not present

## 2023-04-20 DIAGNOSIS — Z1211 Encounter for screening for malignant neoplasm of colon: Secondary | ICD-10-CM | POA: Diagnosis not present

## 2023-04-21 DIAGNOSIS — Z1211 Encounter for screening for malignant neoplasm of colon: Secondary | ICD-10-CM | POA: Diagnosis not present

## 2023-04-21 DIAGNOSIS — C61 Malignant neoplasm of prostate: Secondary | ICD-10-CM | POA: Diagnosis not present

## 2023-04-26 DIAGNOSIS — Z1211 Encounter for screening for malignant neoplasm of colon: Secondary | ICD-10-CM | POA: Diagnosis not present

## 2023-04-26 DIAGNOSIS — C61 Malignant neoplasm of prostate: Secondary | ICD-10-CM | POA: Diagnosis not present

## 2023-04-27 DIAGNOSIS — Z1211 Encounter for screening for malignant neoplasm of colon: Secondary | ICD-10-CM | POA: Diagnosis not present

## 2023-04-27 DIAGNOSIS — C61 Malignant neoplasm of prostate: Secondary | ICD-10-CM | POA: Diagnosis not present

## 2023-04-28 DIAGNOSIS — C61 Malignant neoplasm of prostate: Secondary | ICD-10-CM | POA: Diagnosis not present

## 2023-04-28 DIAGNOSIS — Z1211 Encounter for screening for malignant neoplasm of colon: Secondary | ICD-10-CM | POA: Diagnosis not present

## 2023-04-29 DIAGNOSIS — Z1211 Encounter for screening for malignant neoplasm of colon: Secondary | ICD-10-CM | POA: Diagnosis not present

## 2023-04-29 DIAGNOSIS — C61 Malignant neoplasm of prostate: Secondary | ICD-10-CM | POA: Diagnosis not present

## 2023-04-30 DIAGNOSIS — Z1211 Encounter for screening for malignant neoplasm of colon: Secondary | ICD-10-CM | POA: Diagnosis not present

## 2023-04-30 DIAGNOSIS — C61 Malignant neoplasm of prostate: Secondary | ICD-10-CM | POA: Diagnosis not present

## 2023-05-03 DIAGNOSIS — C61 Malignant neoplasm of prostate: Secondary | ICD-10-CM | POA: Diagnosis not present

## 2023-05-03 DIAGNOSIS — Z1211 Encounter for screening for malignant neoplasm of colon: Secondary | ICD-10-CM | POA: Diagnosis not present

## 2023-05-04 DIAGNOSIS — Z1211 Encounter for screening for malignant neoplasm of colon: Secondary | ICD-10-CM | POA: Diagnosis not present

## 2023-05-04 DIAGNOSIS — C61 Malignant neoplasm of prostate: Secondary | ICD-10-CM | POA: Diagnosis not present

## 2023-05-05 DIAGNOSIS — Z1211 Encounter for screening for malignant neoplasm of colon: Secondary | ICD-10-CM | POA: Diagnosis not present

## 2023-05-05 DIAGNOSIS — C61 Malignant neoplasm of prostate: Secondary | ICD-10-CM | POA: Diagnosis not present

## 2023-05-06 DIAGNOSIS — C61 Malignant neoplasm of prostate: Secondary | ICD-10-CM | POA: Diagnosis not present

## 2023-05-06 DIAGNOSIS — Z1211 Encounter for screening for malignant neoplasm of colon: Secondary | ICD-10-CM | POA: Diagnosis not present

## 2023-05-07 DIAGNOSIS — Z1211 Encounter for screening for malignant neoplasm of colon: Secondary | ICD-10-CM | POA: Diagnosis not present

## 2023-05-07 DIAGNOSIS — C61 Malignant neoplasm of prostate: Secondary | ICD-10-CM | POA: Diagnosis not present

## 2023-05-10 DIAGNOSIS — C61 Malignant neoplasm of prostate: Secondary | ICD-10-CM | POA: Diagnosis not present

## 2023-05-10 DIAGNOSIS — Z1211 Encounter for screening for malignant neoplasm of colon: Secondary | ICD-10-CM | POA: Diagnosis not present

## 2023-05-11 DIAGNOSIS — C61 Malignant neoplasm of prostate: Secondary | ICD-10-CM | POA: Diagnosis not present

## 2023-05-11 DIAGNOSIS — Z1211 Encounter for screening for malignant neoplasm of colon: Secondary | ICD-10-CM | POA: Diagnosis not present

## 2023-05-12 DIAGNOSIS — C61 Malignant neoplasm of prostate: Secondary | ICD-10-CM | POA: Diagnosis not present

## 2023-05-12 DIAGNOSIS — Z1211 Encounter for screening for malignant neoplasm of colon: Secondary | ICD-10-CM | POA: Diagnosis not present

## 2023-05-13 DIAGNOSIS — Z1211 Encounter for screening for malignant neoplasm of colon: Secondary | ICD-10-CM | POA: Diagnosis not present

## 2023-05-13 DIAGNOSIS — C61 Malignant neoplasm of prostate: Secondary | ICD-10-CM | POA: Diagnosis not present

## 2023-05-14 DIAGNOSIS — Z1211 Encounter for screening for malignant neoplasm of colon: Secondary | ICD-10-CM | POA: Diagnosis not present

## 2023-05-14 DIAGNOSIS — C61 Malignant neoplasm of prostate: Secondary | ICD-10-CM | POA: Diagnosis not present

## 2023-05-17 DIAGNOSIS — Z1211 Encounter for screening for malignant neoplasm of colon: Secondary | ICD-10-CM | POA: Diagnosis not present

## 2023-05-17 DIAGNOSIS — C61 Malignant neoplasm of prostate: Secondary | ICD-10-CM | POA: Diagnosis not present

## 2023-05-18 DIAGNOSIS — Z1211 Encounter for screening for malignant neoplasm of colon: Secondary | ICD-10-CM | POA: Diagnosis not present

## 2023-05-18 DIAGNOSIS — C61 Malignant neoplasm of prostate: Secondary | ICD-10-CM | POA: Diagnosis not present

## 2023-05-20 DIAGNOSIS — C61 Malignant neoplasm of prostate: Secondary | ICD-10-CM | POA: Diagnosis not present

## 2023-05-20 DIAGNOSIS — Z1211 Encounter for screening for malignant neoplasm of colon: Secondary | ICD-10-CM | POA: Diagnosis not present

## 2023-05-24 DIAGNOSIS — C61 Malignant neoplasm of prostate: Secondary | ICD-10-CM | POA: Diagnosis not present

## 2023-05-24 DIAGNOSIS — Z1211 Encounter for screening for malignant neoplasm of colon: Secondary | ICD-10-CM | POA: Diagnosis not present

## 2023-05-25 DIAGNOSIS — Z1211 Encounter for screening for malignant neoplasm of colon: Secondary | ICD-10-CM | POA: Diagnosis not present

## 2023-05-25 DIAGNOSIS — C61 Malignant neoplasm of prostate: Secondary | ICD-10-CM | POA: Diagnosis not present

## 2023-05-27 DIAGNOSIS — C61 Malignant neoplasm of prostate: Secondary | ICD-10-CM | POA: Diagnosis not present

## 2023-05-27 DIAGNOSIS — Z1211 Encounter for screening for malignant neoplasm of colon: Secondary | ICD-10-CM | POA: Diagnosis not present

## 2023-05-28 DIAGNOSIS — Z1211 Encounter for screening for malignant neoplasm of colon: Secondary | ICD-10-CM | POA: Diagnosis not present

## 2023-05-28 DIAGNOSIS — C61 Malignant neoplasm of prostate: Secondary | ICD-10-CM | POA: Diagnosis not present

## 2023-05-31 DIAGNOSIS — C61 Malignant neoplasm of prostate: Secondary | ICD-10-CM | POA: Diagnosis not present

## 2023-05-31 DIAGNOSIS — Z1211 Encounter for screening for malignant neoplasm of colon: Secondary | ICD-10-CM | POA: Diagnosis not present

## 2023-06-01 DIAGNOSIS — Z1211 Encounter for screening for malignant neoplasm of colon: Secondary | ICD-10-CM | POA: Diagnosis not present

## 2023-06-01 DIAGNOSIS — C61 Malignant neoplasm of prostate: Secondary | ICD-10-CM | POA: Diagnosis not present

## 2023-06-02 DIAGNOSIS — C61 Malignant neoplasm of prostate: Secondary | ICD-10-CM | POA: Diagnosis not present

## 2023-06-02 DIAGNOSIS — Z1211 Encounter for screening for malignant neoplasm of colon: Secondary | ICD-10-CM | POA: Diagnosis not present

## 2023-06-03 DIAGNOSIS — Z1211 Encounter for screening for malignant neoplasm of colon: Secondary | ICD-10-CM | POA: Diagnosis not present

## 2023-06-03 DIAGNOSIS — C61 Malignant neoplasm of prostate: Secondary | ICD-10-CM | POA: Diagnosis not present

## 2023-06-04 DIAGNOSIS — C61 Malignant neoplasm of prostate: Secondary | ICD-10-CM | POA: Diagnosis not present

## 2023-06-04 DIAGNOSIS — Z1211 Encounter for screening for malignant neoplasm of colon: Secondary | ICD-10-CM | POA: Diagnosis not present

## 2023-06-07 DIAGNOSIS — Z1211 Encounter for screening for malignant neoplasm of colon: Secondary | ICD-10-CM | POA: Diagnosis not present

## 2023-06-07 DIAGNOSIS — C61 Malignant neoplasm of prostate: Secondary | ICD-10-CM | POA: Diagnosis not present

## 2023-06-08 DIAGNOSIS — Z1211 Encounter for screening for malignant neoplasm of colon: Secondary | ICD-10-CM | POA: Diagnosis not present

## 2023-06-08 DIAGNOSIS — C61 Malignant neoplasm of prostate: Secondary | ICD-10-CM | POA: Diagnosis not present

## 2023-06-09 DIAGNOSIS — Z1211 Encounter for screening for malignant neoplasm of colon: Secondary | ICD-10-CM | POA: Diagnosis not present

## 2023-06-09 DIAGNOSIS — C61 Malignant neoplasm of prostate: Secondary | ICD-10-CM | POA: Diagnosis not present

## 2023-06-10 DIAGNOSIS — Z1211 Encounter for screening for malignant neoplasm of colon: Secondary | ICD-10-CM | POA: Diagnosis not present

## 2023-06-10 DIAGNOSIS — C61 Malignant neoplasm of prostate: Secondary | ICD-10-CM | POA: Diagnosis not present

## 2023-06-11 DIAGNOSIS — C61 Malignant neoplasm of prostate: Secondary | ICD-10-CM | POA: Diagnosis not present

## 2023-06-11 DIAGNOSIS — Z1211 Encounter for screening for malignant neoplasm of colon: Secondary | ICD-10-CM | POA: Diagnosis not present

## 2023-06-15 DIAGNOSIS — C61 Malignant neoplasm of prostate: Secondary | ICD-10-CM | POA: Diagnosis not present

## 2023-06-15 DIAGNOSIS — Z1211 Encounter for screening for malignant neoplasm of colon: Secondary | ICD-10-CM | POA: Diagnosis not present

## 2023-06-16 DIAGNOSIS — Z1211 Encounter for screening for malignant neoplasm of colon: Secondary | ICD-10-CM | POA: Diagnosis not present

## 2023-06-16 DIAGNOSIS — C61 Malignant neoplasm of prostate: Secondary | ICD-10-CM | POA: Diagnosis not present

## 2023-06-21 DIAGNOSIS — C61 Malignant neoplasm of prostate: Secondary | ICD-10-CM | POA: Diagnosis not present

## 2023-06-21 DIAGNOSIS — Z1211 Encounter for screening for malignant neoplasm of colon: Secondary | ICD-10-CM | POA: Diagnosis not present

## 2023-06-22 IMAGING — CT CT RENAL STONE PROTOCOL
2 of 4 series · 16 of 46 positions shown, 18 images · non-contrast
Comparison: 02/20/2014 by report

CLINICAL DATA: Lower abdominal pain with dysuria, initial encounter



[Series 2: axial st · axial · 0.73mm/px · z∈[+721,+1106]mm · 13 of 87 slices shown, 15 images]
[im 5/87  soft-tissue]
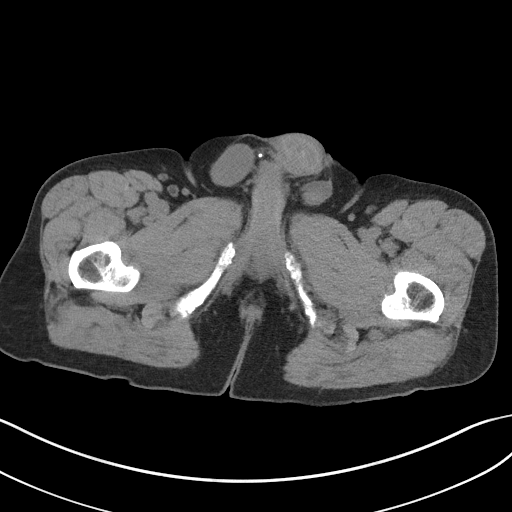
[im 5/87  bone]
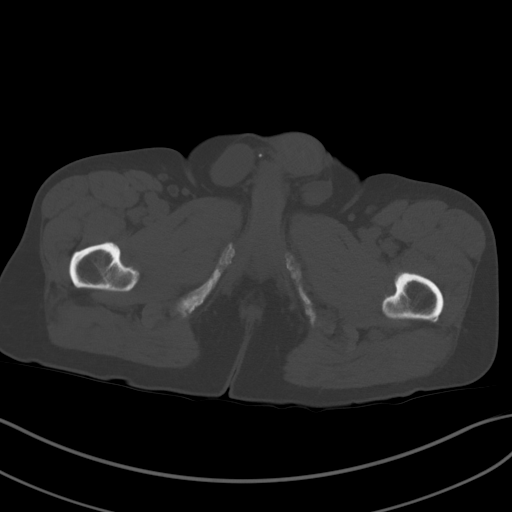
[im 14/87  soft-tissue]
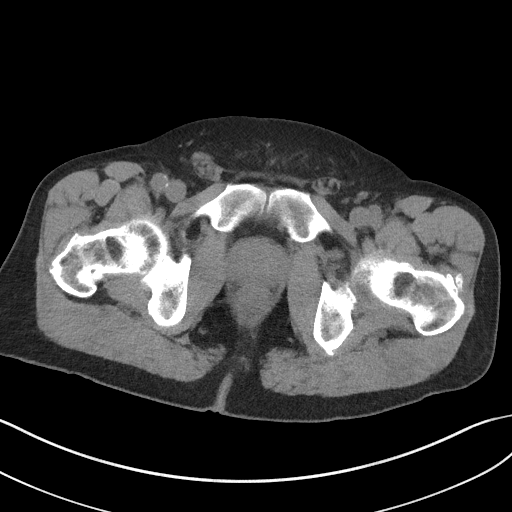
[im 19/87  soft-tissue]
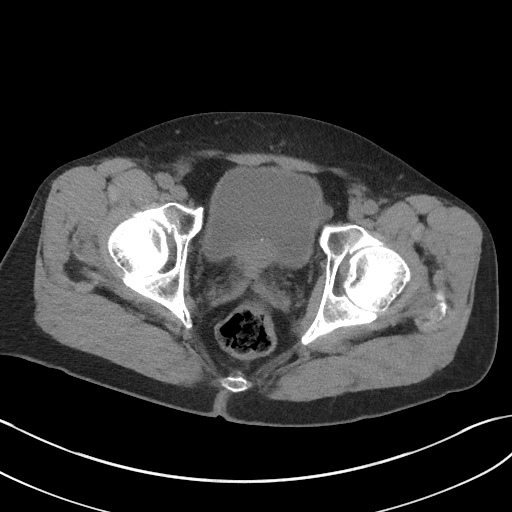
[im 23/87  soft-tissue]
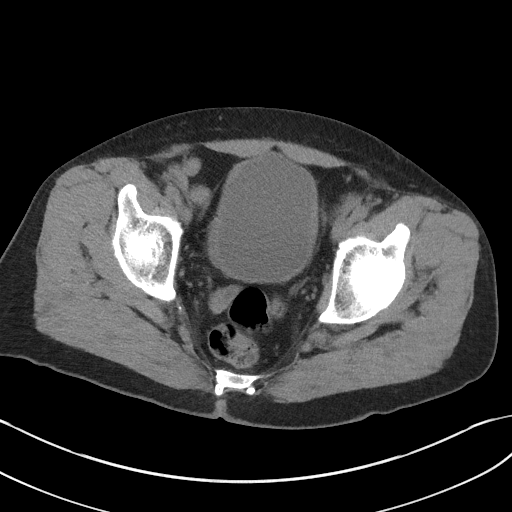
[im 32/87  soft-tissue]
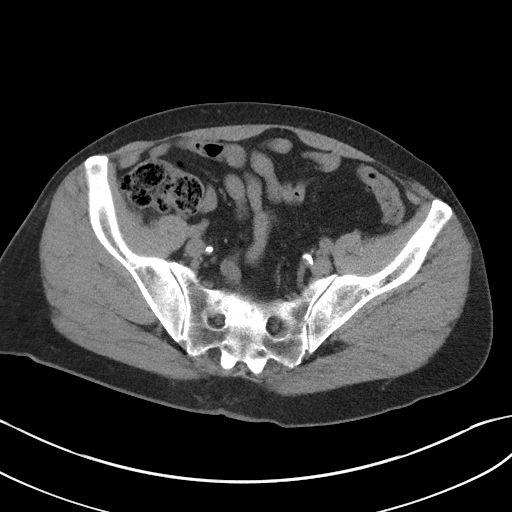
[im 37/87  soft-tissue]
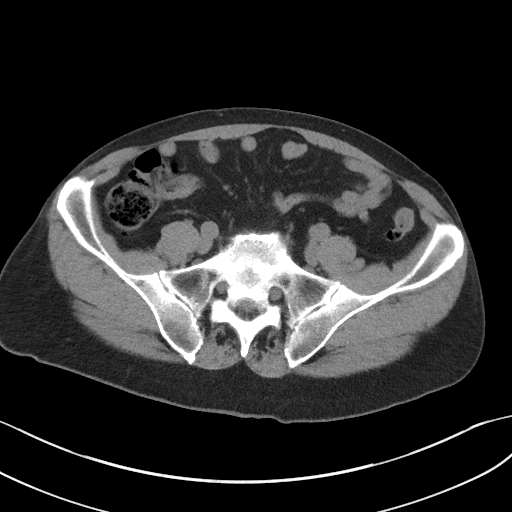
[im 46/87  soft-tissue]
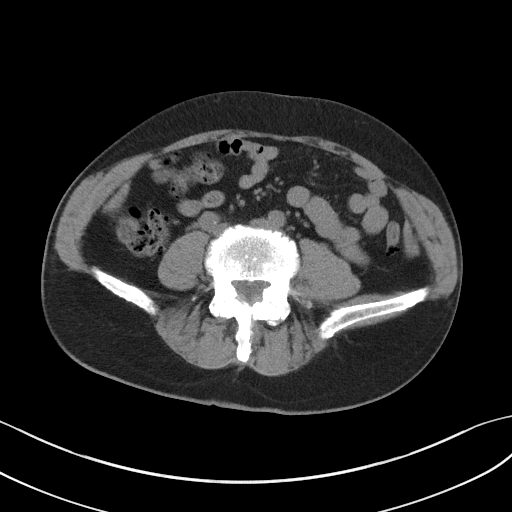
[im 50/87  soft-tissue]
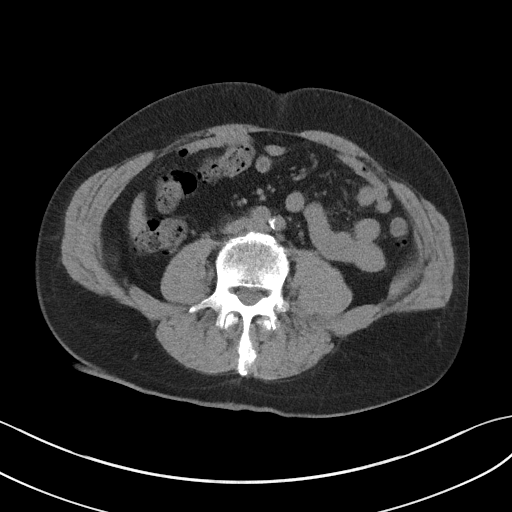
[im 55/87  soft-tissue]
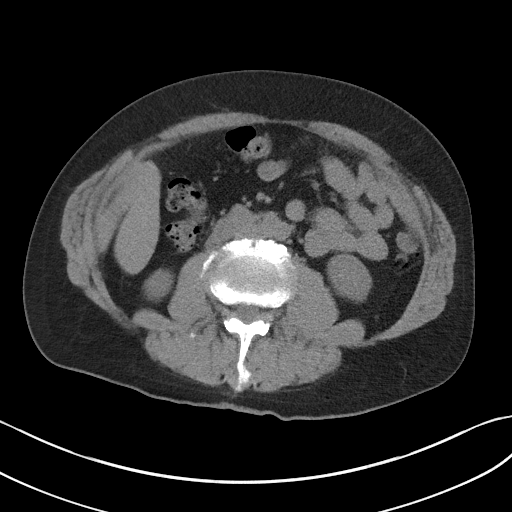
[im 55/87  bone]
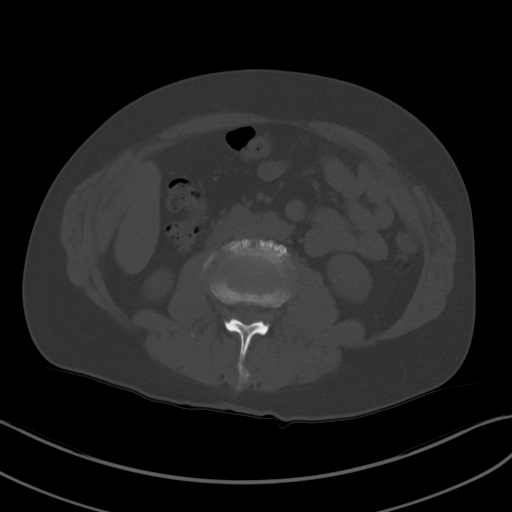
[im 64/87  soft-tissue]
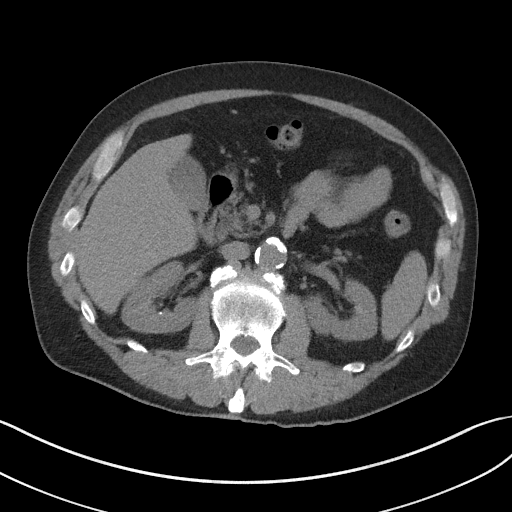
[im 68/87  soft-tissue]
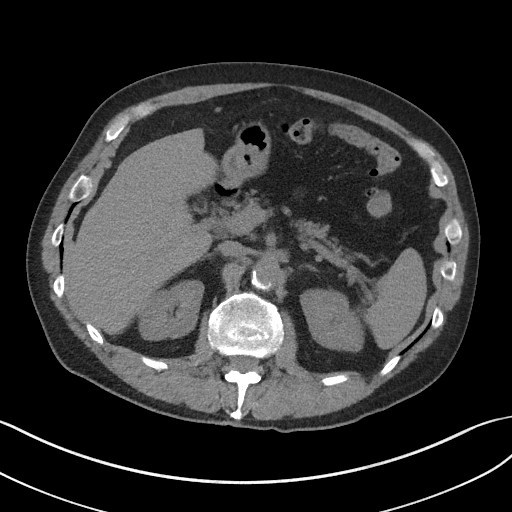
[im 73/87  soft-tissue]
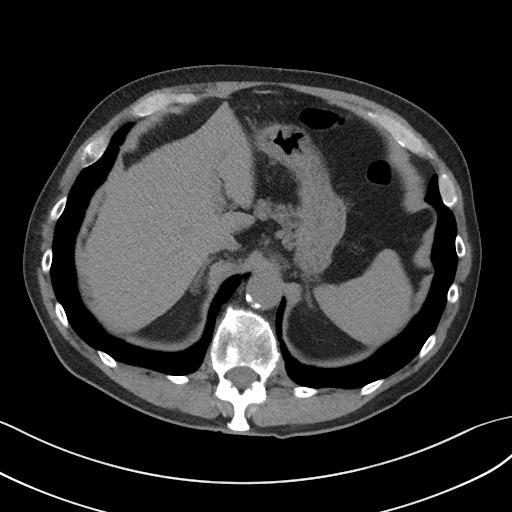
[im 82/87  soft-tissue]
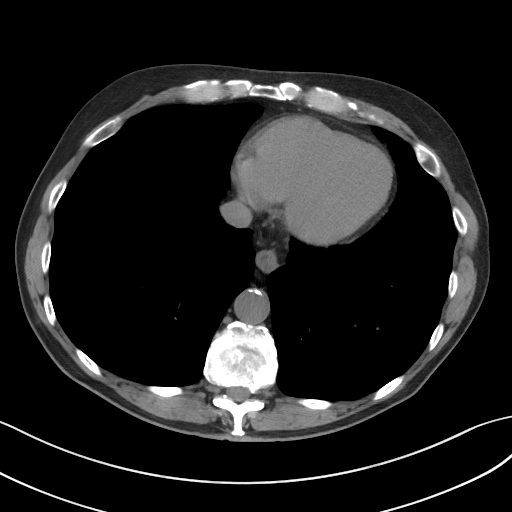

[Series 5: coronal st · coronal · 0.71mm/px · 3 of 95 slices shown]
[im 32/95  soft-tissue]
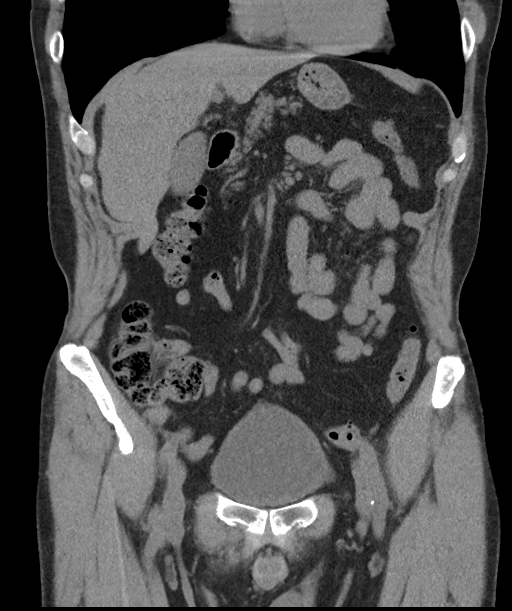
[im 42/95  soft-tissue]
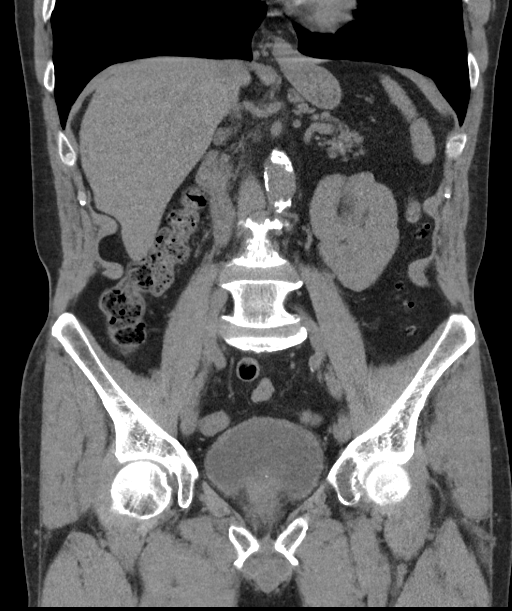
[im 53/95  soft-tissue]
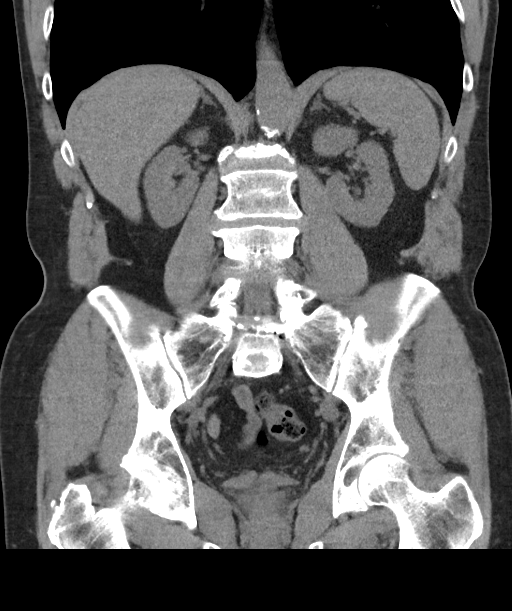

[16 of 46 positions shown; findings below may reference images not displayed]

FINDINGS: Lower chest: No acute abnormality.

Hepatobiliary: No focal liver abnormality is seen. No gallstones,
gallbladder wall thickening, or biliary dilatation.

Pancreas: Unremarkable. No pancreatic ductal dilatation or
surrounding inflammatory changes.

Spleen: Normal in size without focal abnormality.

Adrenals/Urinary Tract: Adrenal glands are within normal limits.
Kidneys are well visualized bilaterally. Tiny 1-2 mm nonobstructing
right renal stone is noted in the midportion of the right kidney.
The ureters are within normal limits. Bladder is well distended. A
small dependent calcification is noted in the right half of the
bladder likely representing recently passed stone.

Stomach/Bowel: Scattered diverticular change of the colon is noted
without evidence of diverticulitis. No obstructive changes are seen.
The appendix is within normal limits. Small bowel and stomach are
unremarkable.

Vascular/Lymphatic: Aortic atherosclerosis. No enlarged abdominal or
pelvic lymph nodes.

Reproductive: Prostate is unremarkable.

Other: No abdominal wall hernia or abnormality. No abdominopelvic
ascites.

Musculoskeletal: No acute bony abnormality is noted. Degenerative
changes of lumbar spine are seen.
IMPRESSION: Tiny nonobstructing right renal stone.

Small calculus in the bladder likely related to previously passed
stone.

Diverticulosis without diverticulitis.

## 2023-06-30 ENCOUNTER — Encounter: Payer: Self-pay | Admitting: Urology

## 2023-06-30 ENCOUNTER — Ambulatory Visit: Payer: Medicare HMO | Admitting: Urology

## 2023-06-30 VITALS — BP 158/82 | HR 74 | Ht 67.0 in | Wt 156.0 lb

## 2023-06-30 DIAGNOSIS — R8290 Unspecified abnormal findings in urine: Secondary | ICD-10-CM

## 2023-06-30 DIAGNOSIS — Z87440 Personal history of urinary (tract) infections: Secondary | ICD-10-CM

## 2023-06-30 DIAGNOSIS — N401 Enlarged prostate with lower urinary tract symptoms: Secondary | ICD-10-CM

## 2023-06-30 DIAGNOSIS — R399 Unspecified symptoms and signs involving the genitourinary system: Secondary | ICD-10-CM | POA: Diagnosis not present

## 2023-06-30 DIAGNOSIS — N35912 Unspecified bulbous urethral stricture, male: Secondary | ICD-10-CM | POA: Diagnosis not present

## 2023-06-30 DIAGNOSIS — C61 Malignant neoplasm of prostate: Secondary | ICD-10-CM

## 2023-06-30 DIAGNOSIS — N138 Other obstructive and reflux uropathy: Secondary | ICD-10-CM

## 2023-06-30 DIAGNOSIS — Z87438 Personal history of other diseases of male genital organs: Secondary | ICD-10-CM | POA: Diagnosis not present

## 2023-06-30 LAB — MICROSCOPIC EXAMINATION: WBC, UA: >30 /[HPF] — AB (ref 0–5)

## 2023-06-30 LAB — URINALYSIS, ROUTINE W REFLEX MICROSCOPIC
Bilirubin, UA: NEGATIVE
Glucose, UA: NEGATIVE
Ketones, UA: NEGATIVE
Nitrite, UA: NEGATIVE
Specific Gravity, UA: 1.02 (ref 1.005–1.030)
Urobilinogen, Ur: 0.2 mg/dL (ref 0.2–1.0)
pH, UA: 6 (ref 5.0–7.5)

## 2023-06-30 MED ORDER — CIPROFLOXACIN HCL 500 MG PO TABS
500.0000 mg | ORAL_TABLET | Freq: Two times a day (BID) | ORAL | 0 refills | Status: AC
Start: 2023-06-30 — End: 2023-07-07

## 2023-06-30 NOTE — Progress Notes (Signed)
 Assessment: 1. Prostate cancer (HCC);  PSA 4.5; GG 3; unfavorable intermediate risk   2. BPH with obstruction/lower urinary tract symptoms   3. 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 History of UTI   5. Abnormal urine findings     Plan: Urine culture sent today Rx for Cipro  500 mg BID x 7 days sent to pharmacy Return to office in 3 months. PSA next visit   Chief Complaint:  Chief Complaint  Patient presents with   Prostate Cancer    History of Present Illness:  Louis Campos is a 73 y.o. year old male who is seen for further evaluation of BPH with obstruction, unfavorable intermediate risk prostate cancer, history of urethral stricture, and history of UTIs. He has a history of UTIs approximately once a year for several years beginning in 2014.  These resolved spontaneously.  About 1 year ago,  he noted foul-smelling and cloudy urine.  He was tested for UTI and by report his culture was negative.  He continued to have foul-smelling and cloudy urine.  He also reported urinary urgency at times, slight discomfort with voiding, weak stream, and nocturia 1-2 times. No gross hematuria or flank pain. IPSS = 13.  PVR = 350 mL. CT renal stone study from 07/11/2021 showed a small nonobstructing right renal stone, small calculus in the bladder, no evidence of renal mass or obstruction. Urine culture from 2/23 grew 25-50K staph haemolyticus. Treated with macrobid .  At his return visit in March 2023, he continued on alfuzosin .  He noted an improvement in the color and odor of his urine.  He continued to have a decreased stream, frequency, and sensation of incomplete emptying.  No dysuria or gross hematuria. IPSS = 15.  PVR = 330 ml. He was changed to silodosin  8 mg daily.  He continued on silodosin  without significant change in his urinary symptoms.  He continued to have a weak stream, sensation of incomplete emptying, frequency and urgency. IPSS =  17. Cystoscopy demonstrated a narrow bulbar urethral stricture which would not allow passage of the cystoscope.  He underwent balloon dilation of the urethral stricture on 10/08/2021.  A OptiLume balloon was used.  He was discharged with a catheter in place. He presented for a voiding trial on 10/13/2021.  He noted gross hematuria beginning 10/12/2021.[name]   His catheter was removed but he was unable to void and returned later that afternoon in retention.  A Foley catheter was placed with return of 750 mL of dark bloody urine.  The catheter was irrigated with return of multiple small clots. His catheter was removed on 10/16/2021 after a successful voiding trial.  At his visit in 6/23, he was voiding spontaneously after foley removal. No gross hematuria.  No dysuria.  He continued with some frequency and urgency.  No significant change in his stream. PVR = 279 ml.  At his visit in 7/23, he continued to report frequency, decreased force of stream, and sensation of incomplete emptying.  His symptoms were primarily in the morning.  He had nocturia 0-1 time per night.  No dysuria or gross hematuria.  He continues on silodosin . IPSS = 14. PVR = 267 ml  He was seen by Dr. Watt in January 2024 following a UTI.  He had a flow rate and PVR in February 2024 which showed a peak flow of 4 mL/s.  Cystoscopy in April 2024 showed a normal anterior urethra but a short segment moderate to severe bulbar  stricture that would not allow passage of the scope. Pelvic ultrasound from 09/08/2022 showed a postvoid volume of 371 mL.  No stones were seen within the bladder. PSA from 08/27/2022 was 4.5 with free PSA of 19.1%.   He continued to have lower urinary tract symptoms including weak stream, sensation of incomplete emptying, and urinary frequency.  No dysuria or gross hematuria.  No UTI symptoms.   IPSS = 17.  He underwent cystoscopy, DVIU, and balloon dilation of a bulbar urethral stricture on 10/14/2022.  He was discharged  with a Foley catheter in place.   His Foley was removed on 10/16/2022 after a successful voiding trial.  At his visit in June 2024, he continued on daily Macrobid  and Rapaflo . He continued to have lower urinary tract symptoms with decreased force of stream, sensation of incomplete emptying, and frequency.  No dysuria or gross hematuria. PVR = 326 ml  ExoDX score elevated at 29.53.  He is s/p transrectal ultrasound and biopsy of the prostate on 12/08/22. PSA: 4.5 ng/ml TRUS volume:  40 ml  PSA density:  0.11 Biopsy results:             Gleason score: 4+ 3 = 7            # positive cores: 1/6 on right    0/6 on left            Location of cancer: mid gland  Complications after biopsy: none Patient with significant LUTS.    IPSS = 16 Patient without erectile dysfunction.   PSA May PET scan from 12/27/2022 showed uptake in the right side of the prostate without evidence of metastatic disease.  Treatment options were discussed with the patient.  He has elected to proceed with IMRT. Management of his BPH with lower urinary tract symptoms prior to initiation of radiation therapy has been recommended.  Options discussed.  He is elected to proceed with a transurethral resection of the prostate.  He is status post a TURP on 02/09/2023.  He did well postoperatively and was discharged home with a Foley catheter on postop day #1. Path showed benign prostatic hyperplasia. His foley was removed on 02/11/23 after a successful voiding trial. He continued on silodosin  and macrobid  daily.  He was doing well following removal of the catheter and was voiding with a good stream.  His frequency was improving.  No dysuria or gross hematuria.   PVR = 47 ml  At his visit in 11/24, he was doing very well from a urinary standpoint.  He reported a good stream and felt like he was emptying his bladder well.  He was not having any frequency, urgency, or dysuria.  No gross hematuria.  He continued on silodosin  and daily  Macrobid . IPSS = 2.  He completed IMRT on 06/21/23. He has done fairly well with the radiation therapy.  He did not have significant urinary symptoms.  Within the past week, he has noted increased frequency and urgency.  No dysuria or gross hematuria.  He also reports that his urine is cloudy.  He is no longer taking silodosin .  Portions of the above documentation were copied from a prior visit for review purposes only.  Past Medical History:  Past Medical History:  Diagnosis Date   Acid reflux    patient denies   Arthritis    Cancer [facility])    Prostate cancer   Hypertension    Pneumonia     Past Surgical History:  Past Surgical History:  Procedure Laterality Date   BALLOON DILATION Bilateral 10/13/2022   Procedure: BALLOON DILATION OF URETHRAL STRICTURE;  Surgeon: Roseann Adine PARAS., MD;  [facility];  Service: Urology;  Laterality: Bilateral;   BIOPSY  09/09/2021   Procedure: BIOPSY;  Surgeon: Cindie Carlin POUR, DO;  [facility];  Service: Endoscopy;;   CYSTOSCOPY N/A 02/09/2023   Procedure: CYSTOSCOPY;  Surgeon: Roseann Adine PARAS., MD;  [facility];  Service: Urology;  Laterality: N/A;   CYSTOSCOPY WITH URETHRAL DILATATION N/A 10/08/2021   Procedure: CYSTOSCOPY WITH URETHRAL DILATATION- balloon dilatation with OPTILUME;  Surgeon: Roseann Adine PARAS., MD;  [facility];  Service: Urology;  Laterality: N/A;   CYSTOSCOPY WITH URETHRAL DILATATION Bilateral 10/13/2022   Procedure: CYSTOSCOPY WITH DIRECT VISUAL INTERNAL URETHROTOMY, URETHRAL DILATATION;  Surgeon: Roseann Adine PARAS., MD;  [facility];  Service: Urology;  Laterality: Bilateral;   ESOPHAGEAL BRUSHING  09/09/2021   Procedure: ESOPHAGEAL BRUSHING;  Surgeon: Cindie Carlin POUR, DO;  [facility];  Service: Endoscopy;;   ESOPHAGOGASTRODUODENOSCOPY (EGD) WITH PROPOFOL  N/A 09/09/2021   Procedure: ESOPHAGOGASTRODUODENOSCOPY (EGD) WITH PROPOFOL ;  Surgeon: Cindie Carlin POUR, DO;   [facility];  Service: Endoscopy;  Laterality: N/A;  2:15pm   EYE SURGERY Bilateral 1990   radial keratotomy   HAND SURGERY     right   PROSTATE BIOPSY     TRANSURETHRAL RESECTION OF PROSTATE Bilateral 02/09/2023   Procedure: TRANSURETHRAL RESECTION OF THE PROSTATE (TURP);  Surgeon: Roseann Adine PARAS., MD;  [facility];  Service: Urology;  Laterality: Bilateral;    Allergies:  No Known Allergies  Family History:  Family History  Problem Relation Age of Onset   Colon cancer Neg Hx    Esophageal cancer Neg Hx     Social History:  Social History   Tobacco Use   Smoking status: Former    Current packs/day: 0.00    Average packs/day: 1.5 packs/day for 20.0 years (30.0 ttl pk-yrs)    Types: Cigarettes    Start date: 51    Quit date: 2000    Years since quitting: 25.1    Passive exposure: Past   Smokeless tobacco: Never  Vaping Use   Vaping status: Never Used  Substance Use Topics   Alcohol use: Yes    Alcohol/week: 1.0 standard drink of alcohol    Types: 1 Cans of beer per week    Comment: couple of shots every week.   Drug use: Not Currently    ROS: Constitutional:  Negative for fever, chills, weight loss CV: Negative for chest pain, previous MI, hypertension Respiratory:  Negative for shortness of breath, wheezing, sleep apnea, frequent cough GI:  Negative for nausea, vomiting, bloody stool, GERD   Physical exam: BP (!) 158/82   Pulse 74   Ht 5' 7 (1.702 m)   Wt 156 lb (70.8 kg)   BMI 24.43 kg/m  GENERAL APPEARANCE:  Well appearing, well developed, well nourished, NAD HEENT:  Atraumatic, normocephalic, oropharynx clear NECK:  Supple without lymphadenopathy or thyromegaly ABDOMEN:  Soft, non-tender, no masses EXTREMITIES:  Moves all extremities well, without clubbing, cyanosis, or edema NEUROLOGIC:  Alert and oriented x 3, normal gait, CN II-XII grossly intact MENTAL STATUS:  appropriate BACK:  Non-tender to palpation, No CVAT SKIN:   Warm, dry, and intact  Results: U/A: >30 WBCs, 11-30 RBCs, few bacteria, nitrite negative

## 2023-07-03 LAB — URINE CULTURE

## 2023-09-14 ENCOUNTER — Telehealth: Payer: Self-pay | Admitting: Urology

## 2023-09-14 NOTE — Telephone Encounter (Signed)
*   called and lvm for pt to call back and r/s due to provider being out of the office on 10/06/23 ANN

## 2023-09-29 ENCOUNTER — Ambulatory Visit: Payer: Medicare HMO | Admitting: Urology

## 2023-10-06 ENCOUNTER — Ambulatory Visit: Admitting: Urology

## 2023-10-27 ENCOUNTER — Other Ambulatory Visit: Payer: Self-pay

## 2023-10-27 ENCOUNTER — Ambulatory Visit: Admitting: Urology

## 2023-10-27 DIAGNOSIS — C61 Malignant neoplasm of prostate: Secondary | ICD-10-CM

## 2023-10-27 NOTE — Progress Notes (Deleted)
 Assessment: 1. Prostate cancer (HCC);  PSA 4.5; GG 3; unfavorable intermediate risk   2. BPH with obstruction/lower urinary tract symptoms   3. 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 History of UTI      Plan: PSA today Return to office in 3 months.  Chief Complaint:  No chief complaint on file.   History of Present Illness:  Louis Campos is a 73 y.o. year old male who is seen for further evaluation of BPH with obstruction, unfavorable intermediate risk prostate cancer, history of urethral stricture, and history of UTIs. He has a history of UTIs approximately once a year for several years beginning in 2014.  These resolved spontaneously.  About 1 year ago,  he noted foul-smelling and cloudy urine.  He was tested for UTI and by report his culture was negative.  He continued to have foul-smelling and cloudy urine.  He also reported urinary urgency at times, slight discomfort with voiding, weak stream, and nocturia 1-2 times. No gross hematuria or flank pain. IPSS = 13.  PVR = 350 mL. CT renal stone study from 07/11/2021 showed a small nonobstructing right renal stone, small calculus in the bladder, no evidence of renal mass or obstruction. Urine culture from 2/23 grew 25-50K staph haemolyticus. Treated with macrobid .  At his return visit in March 2023, he continued on alfuzosin .  He noted an improvement in the color and odor of his urine.  He continued to have a decreased stream, frequency, and sensation of incomplete emptying.  No dysuria or gross hematuria. IPSS = 15.  PVR = 330 ml. He was changed to silodosin  8 mg daily.  He continued on silodosin  without significant change in his urinary symptoms.  He continued to have a weak stream, sensation of incomplete emptying, frequency and urgency. IPSS = 17. Cystoscopy demonstrated a narrow bulbar urethral stricture which would not allow passage of the cystoscope.  He underwent balloon dilation of the  urethral stricture on 10/08/2021.  A OptiLume balloon was used.  He was discharged with a catheter in place. He presented for a voiding trial on 10/13/2021.  He noted gross hematuria beginning 10/12/2021.[name]   His catheter was removed but he was unable to void and returned later that afternoon in retention.  A Foley catheter was placed with return of 750 mL of dark bloody urine.  The catheter was irrigated with return of multiple small clots. His catheter was removed on 10/16/2021 after a successful voiding trial.  At his visit in 6/23, he was voiding spontaneously after foley removal. No gross hematuria.  No dysuria.  He continued with some frequency and urgency.  No significant change in his stream. PVR = 279 ml.  At his visit in 7/23, he continued to report frequency, decreased force of stream, and sensation of incomplete emptying.  His symptoms were primarily in the morning.  He had nocturia 0-1 time per night.  No dysuria or gross hematuria.  He continues on silodosin . IPSS = 14. PVR = 267 ml  He was seen by Dr. Inga Manges in January 2024 following a UTI.  He had a flow rate and PVR in February 2024 which showed a peak flow of 4 mL/s.  Cystoscopy in April 2024 showed a normal anterior urethra but a short segment moderate to severe bulbar stricture that would not allow passage of the scope. Pelvic ultrasound from 09/08/2022 showed a postvoid volume of 371 mL.  No stones were seen within the bladder. PSA  from 08/27/2022 was 4.5 with free PSA of 19.1%.   He continued to have lower urinary tract symptoms including weak stream, sensation of incomplete emptying, and urinary frequency.  No dysuria or gross hematuria.  No UTI symptoms.   IPSS = 17.  He underwent cystoscopy, DVIU, and balloon dilation of a bulbar urethral stricture on 10/14/2022.  He was discharged with a Foley catheter in place.   His Foley was removed on 10/16/2022 after a successful voiding trial.  At his visit in June 2024, he continued on  daily Macrobid  and Rapaflo . He continued to have lower urinary tract symptoms with decreased force of stream, sensation of incomplete emptying, and frequency.  No dysuria or gross hematuria. PVR = 326 ml  ExoDX score elevated at 29.53.  He is s/p transrectal ultrasound and biopsy of the prostate on 12/08/22. PSA: 4.5 ng/ml TRUS volume:  40 ml  PSA density:  0.11 Biopsy results:             Gleason score: 4+ 3 = 7            # positive cores: 1/6 on right    0/6 on left            Location of cancer: mid gland  Complications after biopsy: none Patient with significant LUTS.    IPSS = 16 Patient without erectile dysfunction.   PSA May PET scan from 12/27/2022 showed uptake in the right side of the prostate without evidence of metastatic disease.  Treatment options were discussed with the patient.  He has elected to proceed with IMRT. Management of his BPH with lower urinary tract symptoms prior to initiation of radiation therapy has been recommended.  Options discussed.  He is elected to proceed with a transurethral resection of the prostate.  He is status post a TURP on 02/09/2023.  He did well postoperatively and was discharged home with a Foley catheter on postop day #1. Path showed benign prostatic hyperplasia. His foley was removed on 02/11/23 after a successful voiding trial. He continued on silodosin  and macrobid  daily.  He was doing well following removal of the catheter and was voiding with a good stream.  His frequency was improving.  No dysuria or gross hematuria.   PVR = 47 ml  At his visit in 11/24, he was doing very well from a urinary standpoint.  He reported a good stream and felt like he was emptying his bladder well.  He was not having any frequency, urgency, or dysuria.  No gross hematuria.  He continued on silodosin  and daily Macrobid . IPSS = 2.  He completed IMRT on 06/21/23. He did fairly well with the radiation therapy without any significant urinary symptoms.  Prior  to his visit in 2/25, he noted increased frequency and urgency.  No dysuria or gross hematuria.  He also reported that his cloudy urine.  He was no longer taking silodosin . Urine culture grew 25-50 K E. coli.  Treated with Cipro .  Portions of the above documentation were copied from a prior visit for review purposes only.  Past Medical History:  Past Medical History:  Diagnosis Date   Acid reflux    patient denies   Arthritis    Cancer [facility])    Prostate cancer   Hypertension    Pneumonia     Past Surgical History:  Past Surgical History:  Procedure Laterality Date   BALLOON DILATION Bilateral 10/13/2022   Procedure: BALLOON DILATION OF URETHRAL STRICTURE;  Surgeon: Oda Bence  J., MD;  Location: [facility];  Service: Urology;  Laterality: Bilateral;   BIOPSY  09/09/2021   Procedure: BIOPSY;  Surgeon: Vinetta Greening, DO;  [facility];  Service: Endoscopy;;   CYSTOSCOPY N/A 02/09/2023   Procedure: CYSTOSCOPY;  Surgeon: Mellie Sprinkle., MD;  Location: [facility];  Service: Urology;  Laterality: N/A;   CYSTOSCOPY WITH URETHRAL DILATATION N/A 10/08/2021   Procedure: CYSTOSCOPY WITH URETHRAL DILATATION- balloon dilatation with OPTILUME;  Surgeon: Mellie Sprinkle., MD;  Location: [facility];  Service: Urology;  Laterality: N/A;   CYSTOSCOPY WITH URETHRAL DILATATION Bilateral 10/13/2022   Procedure: CYSTOSCOPY WITH DIRECT VISUAL INTERNAL URETHROTOMY, URETHRAL DILATATION;  Surgeon: Mellie Sprinkle., MD;  [facility]: [facility];  Service: Urology;  Laterality: Bilateral;   ESOPHAGEAL BRUSHING  09/09/2021   Procedure: ESOPHAGEAL BRUSHING;  Surgeon: Vinetta Greening, DO;  [facility];  Service: Endoscopy;;   ESOPHAGOGASTRODUODENOSCOPY (EGD) WITH PROPOFOL  N/A 09/09/2021   Procedure: ESOPHAGOGASTRODUODENOSCOPY (EGD) WITH PROPOFOL ;  Surgeon: Vinetta Greening, DO;  [facility];  Service: Endoscopy;  Laterality: N/A;  2:15pm   EYE SURGERY Bilateral 1990    radial keratotomy   HAND SURGERY     right   PROSTATE BIOPSY     TRANSURETHRAL RESECTION OF PROSTATE Bilateral 02/09/2023   Procedure: TRANSURETHRAL RESECTION OF THE PROSTATE (TURP);  Surgeon: Mellie Sprinkle., MD;  [facility];  Service: Urology;  Laterality: Bilateral;    Allergies:  No Known Allergies  Family History:  Family History  Problem Relation Age of Onset   Colon cancer Neg Hx    Esophageal cancer Neg Hx     Social History:  Social History   Tobacco Use   Smoking status: Former    Current packs/day: 0.00    Average packs/day: 1.5 packs/day for 20.0 years (30.0 ttl pk-yrs)    Types: Cigarettes    Start date: 88    Quit date: 2000    Years since quitting: 25.4    Passive exposure: Past   Smokeless tobacco: Never  Vaping Use   Vaping status: Never Used  Substance Use Topics   Alcohol use: Yes    Alcohol/week: 1.0 standard drink of alcohol    Types: 1 Cans of beer per week    Comment: couple of shots every week.   Drug use: Not Currently    ROS: Constitutional:  Negative for fever, chills, weight loss CV: Negative for chest pain, previous MI, hypertension Respiratory:  Negative for shortness of breath, wheezing, sleep apnea, frequent cough GI:  Negative for nausea, vomiting, bloody stool, GERD   Physical exam: There were no vitals taken for this visit. GENERAL APPEARANCE:  Well appearing, well developed, well nourished, NAD HEENT:  Atraumatic, normocephalic, oropharynx clear NECK:  Supple without lymphadenopathy or thyromegaly ABDOMEN:  Soft, non-tender, no masses EXTREMITIES:  Moves all extremities well, without clubbing, cyanosis, or edema NEUROLOGIC:  Alert and oriented x 3, normal gait, CN II-XII grossly intact MENTAL STATUS:  appropriate BACK:  Non-tender to palpation, No CVAT SKIN:  Warm, dry, and intact  Results: U/A:

## 2023-10-28 ENCOUNTER — Ambulatory Visit: Payer: Self-pay | Admitting: Urology

## 2023-10-28 LAB — PSA: Prostate Specific Ag, Serum: 1 ng/mL (ref 0.0–4.0)

## 2023-11-04 ENCOUNTER — Encounter: Payer: Self-pay | Admitting: Urology

## 2023-11-04 ENCOUNTER — Ambulatory Visit: Admitting: Urology

## 2023-11-04 VITALS — BP 160/79 | HR 59 | Ht 67.0 in | Wt 150.0 lb

## 2023-11-04 DIAGNOSIS — C61 Malignant neoplasm of prostate: Secondary | ICD-10-CM

## 2023-11-04 DIAGNOSIS — Z87440 Personal history of urinary (tract) infections: Secondary | ICD-10-CM

## 2023-11-04 DIAGNOSIS — N401 Enlarged prostate with lower urinary tract symptoms: Secondary | ICD-10-CM | POA: Diagnosis not present

## 2023-11-04 DIAGNOSIS — N138 Other obstructive and reflux uropathy: Secondary | ICD-10-CM | POA: Diagnosis not present

## 2023-11-04 DIAGNOSIS — N35912 Unspecified bulbous urethral stricture, male: Secondary | ICD-10-CM | POA: Diagnosis not present

## 2023-11-04 LAB — URINALYSIS, ROUTINE W REFLEX MICROSCOPIC
Bilirubin, UA: NEGATIVE
Ketones, UA: NEGATIVE
Leukocytes,UA: NEGATIVE
Nitrite, UA: NEGATIVE
RBC, UA: NEGATIVE
Specific Gravity, UA: 1.025 (ref 1.005–1.030)
Urobilinogen, Ur: 0.2 mg/dL (ref 0.2–1.0)
pH, UA: 5.5 (ref 5.0–7.5)

## 2023-11-04 LAB — MICROSCOPIC EXAMINATION: Bacteria, UA: NONE SEEN

## 2023-11-04 MED ORDER — NITROFURANTOIN MONOHYD MACRO 100 MG PO CAPS
100.0000 mg | ORAL_CAPSULE | Freq: Two times a day (BID) | ORAL | 1 refills | Status: AC
Start: 2023-11-04 — End: ?

## 2023-11-04 NOTE — Progress Notes (Signed)
 Assessment: 1. Prostate cancer (HCC);  PSA 4.5; GG 3; unfavorable intermediate risk; s/p XRT 1/25   2. BPH with obstruction/lower urinary tract symptoms   3. 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 History of UTI     Plan: Rx for Macrobid  provided for prn use. Return to office in 4 months with PSA  Chief Complaint:  Chief Complaint  Patient presents with   Prostate Cancer    History of Present Illness:  Louis Campos is a 73 y.o. year old male who is seen for further evaluation of BPH with obstruction, unfavorable intermediate risk prostate cancer, history of urethral stricture, and history of UTIs. He has a history of UTIs approximately once a year for several years beginning in 2014.  These resolved spontaneously.  About 1 year ago,  he noted foul-smelling and cloudy urine.  He was tested for UTI and by report his culture was negative.  He continued to have foul-smelling and cloudy urine.  He also reported urinary urgency at times, slight discomfort with voiding, weak stream, and nocturia 1-2 times. No gross hematuria or flank pain. IPSS = 13.  PVR = 350 mL. CT renal stone study from 07/11/2021 showed a small nonobstructing right renal stone, small calculus in the bladder, no evidence of renal mass or obstruction. Urine culture from 2/23 grew 25-50K staph haemolyticus. Treated with macrobid .  At his return visit in March 2023, he continued on alfuzosin .  He noted an improvement in the color and odor of his urine.  He continued to have a decreased stream, frequency, and sensation of incomplete emptying.  No dysuria or gross hematuria. IPSS = 15.  PVR = 330 ml. He was changed to silodosin  8 mg daily.  He continued on silodosin  without significant change in his urinary symptoms.  He continued to have a weak stream, sensation of incomplete emptying, frequency and urgency. IPSS = 17. Cystoscopy demonstrated a narrow bulbar urethral stricture which would  not allow passage of the cystoscope.  He underwent balloon dilation of the urethral stricture on 10/08/2021.  A OptiLume balloon was used.  He was discharged with a catheter in place. He presented for a voiding trial on 10/13/2021.  He noted gross hematuria beginning 10/12/2021.Louis Campos   His catheter was removed but he was unable to void and returned later that afternoon in retention.  A Foley catheter was placed with return of 750 mL of dark bloody urine.  The catheter was irrigated with return of multiple small clots. His catheter was removed on 10/16/2021 after a successful voiding trial.  At his visit in 6/23, he was voiding spontaneously after foley removal. No gross hematuria.  No dysuria.  He continued with some frequency and urgency.  No significant change in his stream. PVR = 279 ml.  At his visit in 7/23, he continued to report frequency, decreased force of stream, and sensation of incomplete emptying.  His symptoms were primarily in the morning.  He had nocturia 0-1 time per night.  No dysuria or gross hematuria.  He continues on silodosin . IPSS = 14. PVR = 267 ml  He was seen by Dr. Inga Campos in January 2024 following a UTI.  He had a flow rate and PVR in February 2024 which showed a peak flow of 4 mL/s.  Cystoscopy in April 2024 showed a normal anterior urethra but a short segment moderate to severe bulbar stricture that would not allow passage of the scope. Pelvic ultrasound from 09/08/2022 showed  a postvoid volume of 371 mL.  No stones were seen within the bladder. PSA from 08/27/2022 was 4.5 with free PSA of 19.1%.   He continued to have lower urinary tract symptoms including weak stream, sensation of incomplete emptying, and urinary frequency.  No dysuria or gross hematuria.  No UTI symptoms.   IPSS = 17.  He underwent cystoscopy, DVIU, and balloon dilation of a bulbar urethral stricture on 10/14/2022.  He was discharged with a Foley catheter in place.   His Foley was removed on 10/16/2022 after a  successful voiding trial.  At his visit in June 2024, he continued on daily Macrobid  and Rapaflo . He continued to have lower urinary tract symptoms with decreased force of stream, sensation of incomplete emptying, and frequency.  No dysuria or gross hematuria. PVR = 326 ml  ExoDX score elevated at 29.53.  He is s/p transrectal ultrasound and biopsy of the prostate on 12/08/22. PSA: 4.5 ng/ml TRUS volume:  40 ml  PSA density:  0.11 Biopsy results:             Gleason score: 4+ 3 = 7            # positive cores: 1/6 on right    0/6 on left            Location of cancer: mid gland  Complications after biopsy: none Patient with significant LUTS.    IPSS = 16 Patient without erectile dysfunction.   PSA May PET scan from 12/27/2022 showed uptake in the right side of the prostate without evidence of metastatic disease.  Treatment options were discussed with the patient.  He has elected to proceed with IMRT. Management of his BPH with lower urinary tract symptoms prior to initiation of radiation therapy has been recommended.  Options discussed.  He is elected to proceed with a transurethral resection of the prostate.  He is status post a TURP on 02/09/2023.  He did well postoperatively and was discharged home with a Foley catheter on postop day #1. Path showed benign prostatic hyperplasia. His foley was removed on 02/11/23 after a successful voiding trial. He continued on silodosin  and macrobid  daily.  At his visit in 11/24, he was doing very well from a urinary standpoint.  He reported a good stream and felt like he was emptying his bladder well.  He was not having any frequency, urgency, or dysuria.  No gross hematuria.  He continued on silodosin  and daily Macrobid . IPSS = 2.  He completed IMRT on 06/21/23. He did fairly well with the radiation therapy without any significant urinary symptoms.  Prior to his visit in 2/25, he noted increased frequency and urgency.  No dysuria or gross hematuria.   He also reported that his cloudy urine.  He was no longer taking silodosin . Urine culture grew 25-50 K E. coli.  Treated with Cipro .  Post treatment PSA: 4 months 1.0  He returns today for follow-up.  He has done very well following radiation therapy.  No new lower urinary tract symptoms.  He had some UTI symptoms in early April 2025.  He took Macrobid  and his symptoms resolved.  No recent symptoms.  No dysuria or gross hematuria.  Portions of the above documentation were copied from a prior visit for review purposes only.  Past Medical History:  Past Medical History:  Diagnosis Date   Acid reflux    patient denies   Arthritis    Cancer [facility])    Prostate cancer  Hypertension    Pneumonia     Past Surgical History:  Past Surgical History:  Procedure Laterality Date   BALLOON DILATION Bilateral 10/13/2022   Procedure: BALLOON DILATION OF URETHRAL STRICTURE;  Surgeon: Mellie Sprinkle., MD;  [facility];  Service: Urology;  Laterality: Bilateral;   BIOPSY  09/09/2021   Procedure: BIOPSY;  Surgeon: Vinetta Greening, DO;  [facility];  Service: Endoscopy;;   CYSTOSCOPY N/A 02/09/2023   Procedure: CYSTOSCOPY;  Surgeon: Mellie Sprinkle., MD;  [facility];  Service: Urology;  Laterality: N/A;   CYSTOSCOPY WITH URETHRAL DILATATION N/A 10/08/2021   Procedure: CYSTOSCOPY WITH URETHRAL DILATATION- balloon dilatation with OPTILUME;  Surgeon: Mellie Sprinkle., MD;  Location: [facility];  Service: Urology;  Laterality: N/A;   CYSTOSCOPY WITH URETHRAL DILATATION Bilateral 10/13/2022   Procedure: CYSTOSCOPY WITH DIRECT VISUAL INTERNAL URETHROTOMY, URETHRAL DILATATION;  Surgeon: Mellie Sprinkle., MD;  [facility];  Service: Urology;  Laterality: Bilateral;   ESOPHAGEAL BRUSHING  09/09/2021   Procedure: ESOPHAGEAL BRUSHING;  Surgeon: Vinetta Greening, DO;  [facility];  Service: Endoscopy;;   ESOPHAGOGASTRODUODENOSCOPY (EGD) WITH PROPOFOL  N/A  09/09/2021   Procedure: ESOPHAGOGASTRODUODENOSCOPY (EGD) WITH PROPOFOL ;  Surgeon: Vinetta Greening, DO;  [facility];  Service: Endoscopy;  Laterality: N/A;  2:15pm   EYE SURGERY Bilateral 1990   radial keratotomy   HAND SURGERY     right   PROSTATE BIOPSY     TRANSURETHRAL RESECTION OF PROSTATE Bilateral 02/09/2023   Procedure: TRANSURETHRAL RESECTION OF THE PROSTATE (TURP);  Surgeon: Mellie Sprinkle., MD;  [facility];  Service: Urology;  Laterality: Bilateral;    Allergies:  No Known Allergies  Family History:  Family History  Problem Relation Age of Onset   Colon cancer Neg Hx    Esophageal cancer Neg Hx     Social History:  Social History   Tobacco Use   Smoking status: Former    Current packs/day: 0.00    Average packs/day: 1.5 packs/day for 20.0 years (30.0 ttl pk-yrs)    Types: Cigarettes    Start date: 58    Quit date: 2000    Years since quitting: 25.4    Passive exposure: Past   Smokeless tobacco: Never  Vaping Use   Vaping status: Never Used  Substance Use Topics   Alcohol use: Yes    Alcohol/week: 1.0 standard drink of alcohol    Types: 1 Cans of beer per week    Comment: couple of shots every week.   Drug use: Not Currently    ROS: Constitutional:  Negative for fever, chills, weight loss CV: Negative for chest pain, previous MI, hypertension Respiratory:  Negative for shortness of breath, wheezing, sleep apnea, frequent cough GI:  Negative for nausea, vomiting, bloody stool, GERD   Physical exam: BP (!) 160/79   Pulse (!) 59   Ht 5' 7 (1.702 m)   Wt 150 lb (68 kg)   BMI 23.49 kg/m  GENERAL APPEARANCE:  Well appearing, well developed, well nourished, NAD HEENT:  Atraumatic, normocephalic, oropharynx clear NECK:  Supple without lymphadenopathy or thyromegaly ABDOMEN:  Soft, non-tender, no masses EXTREMITIES:  Moves all extremities well, without clubbing, cyanosis, or edema NEUROLOGIC:  Alert and oriented x 3, normal  gait, CN II-XII grossly intact MENTAL STATUS:  appropriate BACK:  Non-tender to palpation, No CVAT SKIN:  Warm, dry, and intact  Results: U/A: 0-5 WBC, 0-2 RBC

## 2024-03-03 ENCOUNTER — Encounter: Payer: Self-pay | Admitting: Urology

## 2024-03-03 ENCOUNTER — Ambulatory Visit: Admitting: Urology

## 2024-03-03 VITALS — BP 167/86 | HR 97 | Ht 70.0 in | Wt 155.0 lb

## 2024-03-03 DIAGNOSIS — R351 Nocturia: Secondary | ICD-10-CM | POA: Diagnosis not present

## 2024-03-03 DIAGNOSIS — R3915 Urgency of urination: Secondary | ICD-10-CM

## 2024-03-03 DIAGNOSIS — N35912 Unspecified bulbous urethral stricture, male: Secondary | ICD-10-CM

## 2024-03-03 DIAGNOSIS — N138 Other obstructive and reflux uropathy: Secondary | ICD-10-CM | POA: Diagnosis not present

## 2024-03-03 DIAGNOSIS — Z87440 Personal history of urinary (tract) infections: Secondary | ICD-10-CM | POA: Diagnosis not present

## 2024-03-03 DIAGNOSIS — R350 Frequency of micturition: Secondary | ICD-10-CM

## 2024-03-03 DIAGNOSIS — N401 Enlarged prostate with lower urinary tract symptoms: Secondary | ICD-10-CM | POA: Diagnosis not present

## 2024-03-03 DIAGNOSIS — C61 Malignant neoplasm of prostate: Secondary | ICD-10-CM

## 2024-03-03 LAB — URINALYSIS, ROUTINE W REFLEX MICROSCOPIC
Bilirubin, UA: NEGATIVE
Ketones, UA: NEGATIVE
Leukocytes,UA: NEGATIVE
Nitrite, UA: NEGATIVE
Protein,UA: NEGATIVE
RBC, UA: NEGATIVE
Specific Gravity, UA: 1.025 (ref 1.005–1.030)
Urobilinogen, Ur: 0.2 mg/dL (ref 0.2–1.0)
pH, UA: 5.5 (ref 5.0–7.5)

## 2024-03-03 LAB — MICROSCOPIC EXAMINATION

## 2024-03-03 NOTE — Progress Notes (Signed)
 Assessment: 1. Prostate cancer (HCC);  PSA 4.5; GG 3; unfavorable intermediate risk; s/p XRT 1/25   2. BPH with obstruction/lower urinary tract symptoms   3. 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 History of UTI     Plan: PSA today Rx for Macrobid  provided for prn use. Return to office in 4 months with PSA  Chief Complaint:  Chief Complaint  Patient presents with   Prostate Cancer    History of Present Illness:  Louis Campos is a 73 y.o. year old male who is seen for further evaluation of BPH with obstruction, unfavorable intermediate risk prostate cancer, history of urethral stricture, and history of UTIs. He has a history of UTIs approximately once a year for several years beginning in 2014.  These resolved spontaneously.  About 1 year ago,  he noted foul-smelling and cloudy urine.  He was tested for UTI and by report his culture was negative.  He continued to have foul-smelling and cloudy urine.  He also reported urinary urgency at times, slight discomfort with voiding, weak stream, and nocturia 1-2 times. No gross hematuria or flank pain. IPSS = 13.  PVR = 350 mL. CT renal stone study from 07/11/2021 showed a small nonobstructing right renal stone, small calculus in the bladder, no evidence of renal mass or obstruction. Urine culture from 2/23 grew 25-50K staph haemolyticus. Treated with macrobid .  At his return visit in March 2023, he continued on alfuzosin .  He noted an improvement in the color and odor of his urine.  He continued to have a decreased stream, frequency, and sensation of incomplete emptying.  No dysuria or gross hematuria. IPSS = 15.  PVR = 330 ml. He was changed to silodosin  8 mg daily.  He continued on silodosin  without significant change in his urinary symptoms.  He continued to have a weak stream, sensation of incomplete emptying, frequency and urgency. IPSS = 17. Cystoscopy demonstrated a narrow bulbar urethral stricture  which would not allow passage of the cystoscope.  He underwent balloon dilation of the urethral stricture on 10/08/2021.  A OptiLume balloon was used.  He was discharged with a catheter in place. He presented for a voiding trial on 10/13/2021.  He noted gross hematuria beginning 10/12/2021.[name]   His catheter was removed but he was unable to void and returned later that afternoon in retention.  A Foley catheter was placed with return of 750 mL of dark bloody urine.  The catheter was irrigated with return of multiple small clots. His catheter was removed on 10/16/2021 after a successful voiding trial.  At his visit in 6/23, he was voiding spontaneously after foley removal. No gross hematuria.  No dysuria.  He continued with some frequency and urgency.  No significant change in his stream. PVR = 279 ml.  At his visit in 7/23, he continued to report frequency, decreased force of stream, and sensation of incomplete emptying.  His symptoms were primarily in the morning.  He had nocturia 0-1 time per night.  No dysuria or gross hematuria.  He continues on silodosin . IPSS = 14. PVR = 267 ml  He was seen by Dr. Watt in January 2024 following a UTI.  He had a flow rate and PVR in February 2024 which showed a peak flow of 4 mL/s.  Cystoscopy in April 2024 showed a normal anterior urethra but a short segment moderate to severe bulbar stricture that would not allow passage of the scope. Pelvic ultrasound from  09/08/2022 showed a postvoid volume of 371 mL.  No stones were seen within the bladder. PSA from 08/27/2022 was 4.5 with free PSA of 19.1%.   He continued to have lower urinary tract symptoms including weak stream, sensation of incomplete emptying, and urinary frequency.  No dysuria or gross hematuria.  No UTI symptoms.   IPSS = 17.  He underwent cystoscopy, DVIU, and balloon dilation of a bulbar urethral stricture on 10/14/2022.  He was discharged with a Foley catheter in place.   His Foley was removed on  10/16/2022 after a successful voiding trial.  At his visit in June 2024, he continued on daily Macrobid  and Rapaflo . He continued to have lower urinary tract symptoms with decreased force of stream, sensation of incomplete emptying, and frequency.  No dysuria or gross hematuria. PVR = 326 ml  ExoDX score elevated at 29.53.  He was evaluated with transrectal ultrasound and biopsy of the prostate on 12/08/22. PSA: 4.5 ng/ml TRUS volume:  40 ml  PSA density:  0.11 Biopsy results:             Gleason score: 4+ 3 = 7            # positive cores: 1/6 on right    0/6 on left            Location of cancer: mid gland  Complications after biopsy: none Patient with significant LUTS.    IPSS = 16 Patient without erectile dysfunction.   PSMA PET scan from 12/27/2022 showed uptake in the right side of the prostate without evidence of metastatic disease.  Treatment options were discussed with the patient.  He has elected to proceed with IMRT. Management of his BPH with lower urinary tract symptoms prior to initiation of radiation therapy has been recommended.  Options discussed.  He is elected to proceed with a transurethral resection of the prostate.  He is status post a TURP on 02/09/2023.  He did well postoperatively and was discharged home with a Foley catheter on postop day #1. Path showed benign prostatic hyperplasia. His foley was removed on 02/11/23 after a successful voiding trial. He continued on silodosin  and macrobid  daily.  At his visit in 11/24, he was doing very well from a urinary standpoint.  He reported a good stream and felt like he was emptying his bladder well.  He was not having any frequency, urgency, or dysuria.  No gross hematuria.  He continued on silodosin  and daily Macrobid . IPSS = 2.  He completed IMRT on 06/21/23. He did fairly well with the radiation therapy without any significant urinary symptoms.  Prior to his visit in 2/25, he noted increased frequency and urgency.  No  dysuria or gross hematuria.  He also reported that his cloudy urine.  He was no longer taking silodosin . Urine culture grew 25-50 K E. coli.  Treated with Cipro .  Post treatment PSA: 4 months 1.0  His visit in June 2025, he was doing well.  No new lower urinary tract symptoms.  He had some UTI symptoms in early April 2025.  He took Macrobid  and his symptoms resolved.  No recent symptoms.  No dysuria or gross hematuria.  He is today for follow-up.  He is doing very well from a urinary standpoint.  He reports only occasional nocturia, frequency, and urgency.  No dysuria or gross hematuria. IPSS = 3/2.  Portions of the above documentation were copied from a prior visit for review purposes only.  Past Medical History:  Past Medical History:  Diagnosis Date   Acid reflux    patient denies   Arthritis    Cancer [facility])    Prostate cancer   Hypertension    Pneumonia     Past Surgical History:  Past Surgical History:  Procedure Laterality Date   BALLOON DILATION Bilateral 10/13/2022   Procedure: BALLOON DILATION OF URETHRAL STRICTURE;  Surgeon: Roseann Adine PARAS., MD;  Location: [facility];  Service: Urology;  Laterality: Bilateral;   BIOPSY  09/09/2021   Procedure: BIOPSY;  Surgeon: Cindie Carlin POUR, DO;  Location: [facility];  Service: Endoscopy;;   CYSTOSCOPY N/A 02/09/2023   Procedure: CYSTOSCOPY;  Surgeon: Roseann Adine PARAS., MD;  Location: [facility];  Service: Urology;  Laterality: N/A;   CYSTOSCOPY WITH URETHRAL DILATATION N/A 10/08/2021   Procedure: CYSTOSCOPY WITH URETHRAL DILATATION- balloon dilatation with OPTILUME;  Surgeon: Roseann Adine PARAS., MD;  Location: [facility];  Service: Urology;  Laterality: N/A;   CYSTOSCOPY WITH URETHRAL DILATATION Bilateral 10/13/2022   Procedure: CYSTOSCOPY WITH DIRECT VISUAL INTERNAL URETHROTOMY, URETHRAL DILATATION;  Surgeon: Roseann Adine PARAS., MD;  Location: [facility];  Service: Urology;  Laterality: Bilateral;   ESOPHAGEAL BRUSHING   09/09/2021   Procedure: ESOPHAGEAL BRUSHING;  Surgeon: Cindie Carlin POUR, DO;  Location: [facility];  Service: Endoscopy;;   ESOPHAGOGASTRODUODENOSCOPY (EGD) WITH PROPOFOL  N/A 09/09/2021   Procedure: ESOPHAGOGASTRODUODENOSCOPY (EGD) WITH PROPOFOL ;  Surgeon: Cindie Carlin POUR, DO;  Location: [facility];  Service: Endoscopy;  Laterality: N/A;  2:15pm   EYE SURGERY Bilateral 1990   radial keratotomy   HAND SURGERY     right   PROSTATE BIOPSY     TRANSURETHRAL RESECTION OF PROSTATE Bilateral 02/09/2023   Procedure: TRANSURETHRAL RESECTION OF THE PROSTATE (TURP);  Surgeon: Roseann Adine PARAS., MD;  Location: [facility];  Service: Urology;  Laterality: Bilateral;    Allergies:  No Known Allergies  Family History:  Family History  Problem Relation Age of Onset   Colon cancer Neg Hx    Esophageal cancer Neg Hx     Social History:  Social History   Tobacco Use   Smoking status: Former    Current packs/day: 0.00    Average packs/day: 1.5 packs/day for 20.0 years (30.0 ttl pk-yrs)    Types: Cigarettes    Start date: 41    Quit date: 2000    Years since quitting: 25.7    Passive exposure: Past   Smokeless tobacco: Never  Vaping Use   Vaping status: Never Used  Substance Use Topics   Alcohol use: Yes    Alcohol/week: 1.0 standard drink of alcohol    Types: 1 Cans of beer per week    Comment: couple of shots every week.   Drug use: Not Currently    ROS: Constitutional:  Negative for fever, chills, weight loss CV: Negative for chest pain, previous MI, hypertension Respiratory:  Negative for shortness of breath, wheezing, sleep apnea, frequent cough GI:  Negative for nausea, vomiting, bloody stool, GERD   Physical exam: BP (!) 167/86   Pulse 97   Ht 5' 10 (1.778 m)   Wt 155 lb (70.3 kg)   BMI 22.24 kg/m  GENERAL APPEARANCE:  Well appearing, well developed, well nourished, NAD HEENT:  Atraumatic, normocephalic, oropharynx clear NECK:  Supple without lymphadenopathy  or thyromegaly ABDOMEN:  Soft, non-tender, no masses EXTREMITIES:  Moves all extremities well, without clubbing, cyanosis, or edema NEUROLOGIC:  Alert and oriented x 3, normal gait, CN II-XII grossly intact MENTAL  STATUS:  appropriate BACK:  Non-tender to palpation, No CVAT SKIN:  Warm, dry, and intact  Results: U/A: 0-5 WBC, 0-2 RBC

## 2024-03-04 LAB — PSA: Prostate Specific Ag, Serum: 0.6 ng/mL (ref 0.0–4.0)

## 2024-03-05 ENCOUNTER — Ambulatory Visit: Payer: Self-pay | Admitting: Urology

## 2024-03-31 DIAGNOSIS — Z1212 Encounter for screening for malignant neoplasm of rectum: Secondary | ICD-10-CM | POA: Diagnosis not present

## 2024-03-31 DIAGNOSIS — Z1211 Encounter for screening for malignant neoplasm of colon: Secondary | ICD-10-CM | POA: Diagnosis not present

## 2024-04-06 LAB — COLOGUARD: COLOGUARD: NEGATIVE

## 2024-07-05 ENCOUNTER — Ambulatory Visit: Admitting: Urology
